# Patient Record
Sex: Female | Born: 1950 | Race: White | Hispanic: No | Marital: Single | Smoking: Former smoker
Health system: Southern US, Community
[De-identification: ages and names within clinical notes are randomized; demographics above are authoritative.]

## PROBLEM LIST (undated history)

## (undated) DIAGNOSIS — E7800 Pure hypercholesterolemia, unspecified: Secondary | ICD-10-CM

## (undated) DIAGNOSIS — I10 Essential (primary) hypertension: Secondary | ICD-10-CM

## (undated) HISTORY — PX: APPENDECTOMY: SHX54

## (undated) HISTORY — PX: BREAST BIOPSY: SHX20

## (undated) HISTORY — PX: TONSILLECTOMY: SUR1361

## (undated) HISTORY — DX: Essential (primary) hypertension: I10

## (undated) HISTORY — DX: Pure hypercholesterolemia, unspecified: E78.00

---

## 2021-08-14 ENCOUNTER — Other Ambulatory Visit: Payer: Self-pay | Admitting: Family Medicine

## 2021-08-14 DIAGNOSIS — R160 Hepatomegaly, not elsewhere classified: Secondary | ICD-10-CM

## 2021-08-24 ENCOUNTER — Ambulatory Visit
Admission: RE | Admit: 2021-08-24 | Discharge: 2021-08-24 | Disposition: A | Discharge status: Home / Self Care | Payer: Self-pay | Source: Ambulatory Visit | Attending: Family Medicine | Admitting: Family Medicine

## 2021-08-24 ENCOUNTER — Other Ambulatory Visit: Payer: Self-pay | Admitting: Family Medicine

## 2021-08-24 DIAGNOSIS — R160 Hepatomegaly, not elsewhere classified: Secondary | ICD-10-CM

## 2021-09-15 ENCOUNTER — Other Ambulatory Visit: Payer: Self-pay

## 2021-09-15 ENCOUNTER — Ambulatory Visit (HOSPITAL_COMMUNITY)
Admission: RE | Admit: 2021-09-15 | Discharge: 2021-09-15 | Disposition: A | Discharge status: Home / Self Care | Payer: No Typology Code available for payment source | Source: Ambulatory Visit | Attending: Family Medicine | Admitting: Family Medicine

## 2021-09-15 DIAGNOSIS — I6522 Occlusion and stenosis of left carotid artery: Secondary | ICD-10-CM | POA: Insufficient documentation

## 2021-09-18 ENCOUNTER — Encounter: Payer: Self-pay | Admitting: Vascular Surgery

## 2021-09-18 ENCOUNTER — Ambulatory Visit (INDEPENDENT_AMBULATORY_CARE_PROVIDER_SITE_OTHER): Payer: No Typology Code available for payment source | Admitting: Vascular Surgery

## 2021-09-18 DIAGNOSIS — I6529 Occlusion and stenosis of unspecified carotid artery: Secondary | ICD-10-CM | POA: Insufficient documentation

## 2021-09-18 DIAGNOSIS — I6523 Occlusion and stenosis of bilateral carotid arteries: Secondary | ICD-10-CM

## 2021-09-18 MED ORDER — CLOPIDOGREL BISULFATE 75 MG PO TABS: 75.0000 mg | ORAL_TABLET | Freq: Every day | ORAL | 6 refills | Status: DC

## 2021-09-18 MED ORDER — ATORVASTATIN CALCIUM 20 MG PO TABS: 20.0000 mg | ORAL_TABLET | Freq: Every day | ORAL | 11 refills | Status: DC

## 2021-09-18 NOTE — Progress Notes (Signed)
? ? ?Patient name: Sally Fernandez MRN: [id number] DOB: 12-05-1950 Sex: female ? ?REASON FOR CONSULT: History of left carotid aneurysm and stenosis (per referral) ? ?HPI: ?Sally Fernandez is a 71 y.o. female with history of hypertension and carotid artery disease that presents for evaluation of possible carotid aneurysm and stenosis.  Patient is here with her daughter who is the interpreter and she speaks [place] otherwise.  On further discussion with the daughter she states that she googled some records from [place] with a Designer, fashion/clothing and was not sure if it was an aneurysm or stenosis that her mother had.  On further discussion she is only aware that her mom has had a blockage that has been followed in [place].  No history of strokes or TIAs.  She is on Lipitor and Plavix.  She had no neck surgery or neck radiation in the past.   ? ?History reviewed. No pertinent past medical history. ? ?Past Surgical History:  ?Procedure Laterality Date  ? APPENDECTOMY    ? BREAST BIOPSY    ? TONSILLECTOMY    ? ? ?History reviewed. No pertinent family history. ? ?SOCIAL HISTORY: ?Social History  ? ?Socioeconomic History  ? Marital status: Single  ?  Spouse name: Not on file  ? Number of children: Not on file  ? Years of education: Not on file  ? Highest education level: Not on file  ?Occupational History  ? Not on file  ?Tobacco Use  ? Smoking status: Former  ?  Types: Cigarettes  ?  Quit date: 44  ?  Years since quitting: 30.3  ? Smokeless tobacco: Never  ?Substance and Sexual Activity  ? Alcohol use: Yes  ? Drug use: Never  ? Sexual activity: Not on file  ?Other Topics Concern  ? Not on file  ?Social History Narrative  ? Not on file  ? ?Social Determinants of Health  ? ?Financial Resource Strain: Not on file  ?Food Insecurity: Not on file  ?Transportation Needs: Not on file  ?Physical Activity: Not on file  ?Stress: Not on file  ?Social Connections: Not on file  ?Intimate Partner Violence: Not on file  ? ? ?No Known  Allergies ? ?Current Outpatient Medications  ?Medication Sig Dispense Refill  ? atorvastatin (LIPITOR) 20 MG tablet Take 20 mg by mouth daily.    ? ?No current facility-administered medications for this visit.  ? ? ?REVIEW OF SYSTEMS:  ?[X]  denotes positive finding, [ ]  denotes negative finding ?Cardiac  Comments:  ?Chest pain or chest pressure:    ?Shortness of breath upon exertion:    ?Short of breath when lying flat:    ?Irregular heart rhythm:    ?    ?Vascular    ?Pain in calf, thigh, or hip brought on by ambulation:    ?Pain in feet at night that wakes you up from your sleep:     ?Blood clot in your veins:    ?Leg swelling:     ?    ?Pulmonary    ?Oxygen at home:    ?Productive cough:     ?Wheezing:     ?    ?Neurologic    ?Sudden weakness in arms or legs:     ?Sudden numbness in arms or legs:     ?Sudden onset of difficulty speaking or slurred speech:    ?Temporary loss of vision in one eye:     ?Problems with dizziness:     ?    ?Gastrointestinal    ?  Blood in stool:     ?Vomited blood:     ?    ?Genitourinary    ?Burning when urinating:     ?Blood in urine:    ?    ?Psychiatric    ?Major depression:     ?    ?Hematologic    ?Bleeding problems:    ?Problems with blood clotting too easily:    ?    ?Skin    ?Rashes or ulcers:    ?    ?Constitutional    ?Fever or chills:    ? ? ?PHYSICAL EXAM: ?Vitals:  ? 09/18/21 0924 09/18/21 0928  ?BP: (!) 131/91 130/85  ?Pulse: 70 70  ?Resp: 14   ?Temp: (!) 97.3 ?F (36.3 ?C)   ?TempSrc: Temporal   ?Weight: 152 lb (68.9 kg)   ?Height: 5' 7.5" (1.715 m)   ? ? ?GENERAL: The patient is a well-nourished female, in no acute distress. The vital signs are documented above. ?CARDIAC: There is a regular rate and rhythm.  ?VASCULAR:  ?Palpable radial pulses bilaterally ?Palpable femoral pulses bilaterally ?Palpable DP pulses bilaterally ?PULMONARY: No respiratory distress. ?ABDOMEN: Soft and non-tender. ?MUSCULOSKELETAL: There are no major deformities or cyanosis. ?NEUROLOGIC: No focal  weakness or paresthesias are detected.  CN II-XII grossly intact. ?SKIN: There are no ulcers or rashes noted. ?PSYCHIATRIC: The patient has a normal affect. ? ?DATA:  ? ?Carotid duplex 09/15/21: Evidence of 60 to 79% right ICA stenosis and 40 to 59% left ICA stenosis ? ?Assessment/Plan: ? ?71 year old female presents for evaluation of carotid artery disease.  The referral states evaluate carotid aneurysm and stenosis.  On further discussion with the patient's daughter, she is only aware that her mother had carotid stenosis based on her records from [place].  This has asymptomatic carotid disease with no history of stroke or TIA.  Discussed her ultrasound today shows evidence of a 60 to 79% right ICA stenosis and a 40 to 59% left ICA stenosis.  Given her carotid disease is asymptomatic with no recent history of strokes or TIAs discussed medical management and no indication for surgical intervention unless greater than 80% stenosis and this would be for stroke risk reduction.  I did refill her Plavix that she has been on for years in [place] for her carotid stenosis and also Lipitor.  I will see her in 6 months with carotid duplex.  I did discuss basic difference of carotid endarterectomy versus TCAR if she requires intervention in the future. ? ? ?Cephus Shelling, MD ?[facility] ?Office: [phone] ? ? ? ? ?

## 2021-09-22 ENCOUNTER — Other Ambulatory Visit: Payer: Self-pay | Admitting: *Deleted

## 2021-09-22 DIAGNOSIS — I6523 Occlusion and stenosis of bilateral carotid arteries: Secondary | ICD-10-CM

## 2021-10-13 ENCOUNTER — Encounter: Payer: Self-pay | Admitting: *Deleted

## 2021-10-27 ENCOUNTER — Ambulatory Visit (INDEPENDENT_AMBULATORY_CARE_PROVIDER_SITE_OTHER): Payer: No Typology Code available for payment source | Admitting: Internal Medicine

## 2021-10-27 ENCOUNTER — Encounter: Payer: Self-pay | Admitting: Internal Medicine

## 2021-10-27 VITALS — BP 144/84 | HR 69 | Ht 67.0 in | Wt 155.0 lb

## 2021-10-27 DIAGNOSIS — R072 Precordial pain: Secondary | ICD-10-CM

## 2021-10-27 MED ORDER — METOPROLOL TARTRATE 50 MG PO TABS: 50.0000 mg | ORAL_TABLET | Freq: Once | ORAL | 0 refills | Status: DC

## 2021-10-27 NOTE — Progress Notes (Signed)
Cardiology Office Note:    Date:  10/27/2021   ID:  Sally Fernandez, DOB 12-May-1951, MRN [id number]  PCP:  Verlon Au, MD   [facility] Providers Cardiologist:  None     Referring MD: Quita Skye, PA-C   No chief complaint on file. Chest pain  History of Present Illness:    Sally Fernandez is a 71 y.o. female with a hx of rheumatoid arthritis, former smoker quit in 1993, carotid artery dx, referral for chest pain  Blood pressure yesterday with primary provider was 110/60 mmHg. She was started on losartan 25 mg daily. Past SBP up to 130s.   She notes left sided chest pain that radiates to her back. She notes she saw a cardiologist in [place]. She was told she had CAD and insufficiency of a heart valve? No hx of cath. No stress testing.  Not on anything for heart rhythm. Once in awhile can get dizzy/woozy with low Bps. With normal Bps no symptoms. She fainted 5 years ago and she fainted. She had 24 cardiac monitor.No recommendation for PPM.   BP log shows predominantly normal Bps.  Cardiovascular Studies Carotid duplex 09/15/21: Evidence of 60 to 79% right ICA stenosis and 40 to 59% left ICA stenosis  No past medical history on file.  Past Surgical History:  Procedure Laterality Date   APPENDECTOMY     BREAST BIOPSY     TONSILLECTOMY      Current Medications: No outpatient medications have been marked as taking for the 10/27/21 encounter (Appointment) with Maisie Fus, MD.     Allergies:   Patient has no known allergies.   Social History   Socioeconomic History   Marital status: Single    Spouse name: Not on file   Number of children: Not on file   Years of education: Not on file   Highest education level: Not on file  Occupational History   Not on file  Tobacco Use   Smoking status: Former    Types: Cigarettes    Quit date: 31    Years since quitting: 30.4   Smokeless tobacco: Never  Substance and Sexual Activity   Alcohol use: Yes   Drug  use: Never   Sexual activity: Not on file  Other Topics Concern   Not on file  Social History Narrative   Not on file   Social Determinants of Health   Financial Resource Strain: Not on file  Food Insecurity: Not on file  Transportation Needs: Not on file  Physical Activity: Not on file  Stress: Not on file  Social Connections: Not on file     Family History: Brother has heart disease  ROS:   Please see the history of present illness.     All other systems reviewed and are negative.  EKGs/Labs/Other Studies Reviewed:    The following studies were reviewed today:   EKG:  EKG is  ordered today.  The ekg ordered today demonstrates   10/27/2021- NSR, no q waves  Recent Labs: No results found for requested labs within last 365 days.  Recent Lipid Panel No results found for: "CHOL", "TRIG", "HDL", "CHOLHDL", "VLDL", "LDLCALC", "LDLDIRECT"   Risk Assessment/Calculations:           Physical Exam:    VS:    Vitals:   10/27/21 1440  BP: (!) 144/84  Pulse: 69  SpO2: 99%     Wt Readings from Last 3 Encounters:  09/18/21 152 lb (68.9 kg)  GEN:  Well nourished, well developed in no acute distress HEENT: Normal NECK: No JVD; No carotid bruits LYMPHATICS: No lymphadenopathy CARDIAC: RRR, no murmurs, rubs, gallops RESPIRATORY:  Clear to auscultation without rales, wheezing or rhonchi  ABDOMEN: Soft, non-tender, non-distended MUSCULOSKELETAL:  No edema; No deformity  SKIN: Warm and dry NEUROLOGIC:  Alert and oriented x 3 PSYCHIATRIC:  Normal affect   ASSESSMENT:    Chest Pain: She notes intermittent L sided chest tightness and ?hx of CAD. With risk including age and carotid dx , as well as former smoking will get a coronary CTA.  HTN: well controlled at home. Continue losartan 25 mg daily.  Carotid Artery Dx: no symptoms. She has no hx of CVA/TIA.Continue statin and plavix 75 mg daily ( started in [place]). Followed by vascular  PLAN:    In order of  problems listed above:  TTE Coronary CTA Follow up 6 months           Medication Adjustments/Labs and Tests Ordered: Current medicines are reviewed at length with the patient today.  Concerns regarding medicines are outlined above.  No orders of the defined types were placed in this encounter.  No orders of the defined types were placed in this encounter.   There are no Patient Instructions on file for this visit.   Signed, Maisie Fus, MD  10/27/2021 1:05 PM    [facility]

## 2021-10-27 NOTE — Patient Instructions (Signed)
Medication Instructions:  PLEASE TAKE METOPROLOL TARTRATE 50mg  TWO HOURS PRIOR TO CCTA SCAN  *If you need a refill on your cardiac medications before your next appointment, please call your pharmacy*  Lab Work: BLOOD WORK TODAY  If you have labs (blood work) drawn today and your tests are completely normal, you will receive your results only by: MyChart Message (if you have MyChart) OR A paper copy in the mail If you have any lab test that is abnormal or we need to change your treatment, we will call you to review the results.  Testing/Procedures: Your physician has requested that you have an echocardiogram. Echocardiography is a painless test that uses sound waves to create images of your heart. It provides your doctor with information about the size and shape of your heart and how well your heart's chambers and valves are working. You may receive an ultrasound enhancing agent through an IV if needed to better visualize your heart during the echo.This procedure takes approximately one hour. There are no restrictions for this procedure. This will take place at the [address], [address].     Your cardiac CT will be scheduled at one of the below locations:   [facility] [address] [place], [place] [phone]  If scheduled at [facility], please arrive at the [facility] and Children's Entrance (Entrance C2) of [facility] 30 minutes prior to test start time. You can use the FREE valet parking offered at entrance C (encouraged to control the heart rate for the test)  Proceed to the [facility] Radiology Department (first floor) to check-in and test prep.  All radiology patients and guests should use entrance C2 at [facility], accessed from [facility], even though the hospital's physical address listed is [address].     Please follow these instructions carefully (unless otherwise directed):  On the Night  Before the Test: Be sure to Drink plenty of water. Do not consume any caffeinated/decaffeinated beverages or chocolate 12 hours prior to your test. Do not take any antihistamines 12 hours prior to your test.  On the Day of the Test: Drink plenty of water until 1 hour prior to the test. Do not eat any food 4 hours prior to the test. You may take your regular medications prior to the test.  Take metoprolol (Lopressor) two hours prior to test. HOLD Furosemide/Hydrochlorothiazide morning of the test. FEMALES- please wear underwire-free bra if available, avoid dresses & tight clothing      After the Test: Drink plenty of water. After receiving IV contrast, you may experience a mild flushed feeling. This is normal. On occasion, you may experience a mild rash up to 24 hours after the test. This is not dangerous. If this occurs, you can take Benadryl 25 mg and increase your fluid intake. If you experience trouble breathing, this can be serious. If it is severe call 911 IMMEDIATELY. If it is mild, please call our office. If you take any of these medications: Glipizide/Metformin, Avandament, Glucavance, please do not take 48 hours after completing test unless otherwise instructed.  We will call to schedule your test 2-4 weeks out understanding that some insurance companies will need an authorization prior to the service being performed.   For non-scheduling related questions, please contact the cardiac imaging nurse navigator should you have any questions/concerns: [address], Cardiac Imaging Nurse Navigator [place], Cardiac Imaging Nurse Navigator [facility]  Direct Office Dial: [phone]   For scheduling needs, including cancellations and rescheduling, please call [place], [phone].  Follow-Up: At [facility], you and your health needs are our priority.  As part of our continuing mission to provide you with exceptional heart care, we have created  designated Provider Care Teams.  These Care Teams include your primary Cardiologist (physician) and Advanced Practice Providers (APPs -  Physician Assistants and Nurse Practitioners) who all work together to provide you with the care you need, when you need it.  Your next appointment:   6 month(s)  The format for your next appointment:   In Person  Provider:   Maisie Fus, MD

## 2021-10-28 LAB — BASIC METABOLIC PANEL
BUN/Creatinine Ratio: 21 (ref 12–28)
BUN: 15 mg/dL (ref 8–27)
CO2: 27 mmol/L (ref 20–29)
Calcium: 9.7 mg/dL (ref 8.7–10.3)
Chloride: 100 mmol/L (ref 96–106)
Creatinine, Ser: 0.73 mg/dL (ref 0.57–1.00)
Glucose: 84 mg/dL (ref 70–99)
Potassium: 4.3 mmol/L (ref 3.5–5.2)
Sodium: 138 mmol/L (ref 134–144)
eGFR: 88 mL/min/{1.73_m2} (ref >59)

## 2021-11-09 ENCOUNTER — Ambulatory Visit (HOSPITAL_COMMUNITY): Payer: No Typology Code available for payment source | Attending: Internal Medicine

## 2021-11-09 DIAGNOSIS — R072 Precordial pain: Secondary | ICD-10-CM

## 2021-11-09 LAB — ECHOCARDIOGRAM COMPLETE
Area-P 1/2: 3.46 cm2
S' Lateral: 2.7 cm

## 2021-11-16 ENCOUNTER — Telehealth (HOSPITAL_COMMUNITY): Payer: Self-pay | Admitting: *Deleted

## 2021-11-16 NOTE — Telephone Encounter (Signed)
Reaching out to patient to offer assistance regarding upcoming cardiac imaging study; pt's daughter answered phone and verbalizes understanding of appt date/time, parking situation and where to check in, pre-test NPO status and medications ordered, and verified current allergies; name and call back number provided for further questions should they arise  Larey Brick RN Navigator Cardiac Imaging [facility] [phone] office [phone] cell  Patient speaks [place]. Patient will take 50mg  metoprolol tartrate TWO hours prior to her cardiac CT scan. Patient's daughter is aware they will need to arrive at 12pm.

## 2021-11-18 ENCOUNTER — Other Ambulatory Visit: Payer: Self-pay | Admitting: Cardiovascular Disease

## 2021-11-18 ENCOUNTER — Ambulatory Visit (HOSPITAL_COMMUNITY)
Admission: RE | Admit: 2021-11-18 | Discharge: 2021-11-18 | Disposition: A | Discharge status: Home / Self Care | Payer: No Typology Code available for payment source | Source: Ambulatory Visit | Attending: Cardiovascular Disease | Admitting: Cardiovascular Disease

## 2021-11-18 ENCOUNTER — Ambulatory Visit (HOSPITAL_BASED_OUTPATIENT_CLINIC_OR_DEPARTMENT_OTHER)
Admission: RE | Admit: 2021-11-18 | Discharge: 2021-11-18 | Disposition: A | Discharge status: Home / Self Care | Payer: No Typology Code available for payment source | Source: Ambulatory Visit | Attending: Cardiovascular Disease | Admitting: Cardiovascular Disease

## 2021-11-18 ENCOUNTER — Ambulatory Visit (HOSPITAL_COMMUNITY)
Admission: RE | Admit: 2021-11-18 | Discharge: 2021-11-18 | Disposition: A | Discharge status: Home / Self Care | Payer: No Typology Code available for payment source | Source: Ambulatory Visit | Attending: Internal Medicine | Admitting: Internal Medicine

## 2021-11-18 DIAGNOSIS — I2510 Atherosclerotic heart disease of native coronary artery without angina pectoris: Secondary | ICD-10-CM

## 2021-11-18 DIAGNOSIS — R931 Abnormal findings on diagnostic imaging of heart and coronary circulation: Secondary | ICD-10-CM

## 2021-11-18 DIAGNOSIS — R072 Precordial pain: Secondary | ICD-10-CM | POA: Insufficient documentation

## 2021-11-18 MED ORDER — NITROGLYCERIN 0.4 MG SL SUBL
SUBLINGUAL_TABLET | SUBLINGUAL | Status: AC
  Filled 2021-11-18: qty 2

## 2021-11-18 MED ORDER — IOHEXOL 350 MG/ML SOLN
100.0000 mL | Freq: Once | INTRAVENOUS | Status: AC | PRN
  Administered 2021-11-18: 100 mL via INTRAVENOUS

## 2021-11-18 MED ORDER — NITROGLYCERIN 0.4 MG SL SUBL
0.8000 mg | SUBLINGUAL_TABLET | Freq: Once | SUBLINGUAL | Status: AC
  Administered 2021-11-18: 0.8 mg via SUBLINGUAL

## 2021-11-18 NOTE — Progress Notes (Signed)
CT FFR ordered.   Gerri Spore T. Flora Lipps, MD, [facility]  [facility]  [address] [place], [place] [address] [phone]  3:41 PM

## 2021-11-23 ENCOUNTER — Other Ambulatory Visit: Payer: Self-pay | Admitting: *Deleted

## 2021-11-23 DIAGNOSIS — I6523 Occlusion and stenosis of bilateral carotid arteries: Secondary | ICD-10-CM

## 2021-11-23 DIAGNOSIS — I6522 Occlusion and stenosis of left carotid artery: Secondary | ICD-10-CM

## 2021-11-23 NOTE — Progress Notes (Signed)
lipid

## 2021-12-03 ENCOUNTER — Other Ambulatory Visit: Payer: Self-pay | Admitting: Physician Assistant

## 2021-12-03 ENCOUNTER — Ambulatory Visit
Admission: RE | Admit: 2021-12-03 | Discharge: 2021-12-03 | Disposition: A | Discharge status: Home / Self Care | Payer: No Typology Code available for payment source | Source: Ambulatory Visit | Attending: Physician Assistant | Admitting: Physician Assistant

## 2021-12-03 DIAGNOSIS — M25552 Pain in left hip: Secondary | ICD-10-CM

## 2021-12-11 LAB — LIPID PANEL
Chol/HDL Ratio: 3 ratio (ref 0.0–4.4)
Cholesterol, Total: 150 mg/dL (ref 100–199)
HDL: 50 mg/dL (ref >39)
LDL Chol Calc (NIH): 86 mg/dL (ref 0–99)
Triglycerides: 73 mg/dL (ref 0–149)
VLDL Cholesterol Cal: 14 mg/dL (ref 5–40)

## 2021-12-15 ENCOUNTER — Encounter: Payer: Self-pay | Admitting: *Deleted

## 2021-12-15 ENCOUNTER — Telehealth: Payer: Self-pay | Admitting: *Deleted

## 2021-12-15 MED ORDER — ASPIRIN 81 MG PO TBEC: 81.0000 mg | DELAYED_RELEASE_TABLET | Freq: Every day | ORAL | 3 refills | Status: AC

## 2021-12-15 MED ORDER — ATORVASTATIN CALCIUM 40 MG PO TABS: 40.0000 mg | ORAL_TABLET | Freq: Every day | ORAL | 3 refills | Status: DC

## 2021-12-15 NOTE — Telephone Encounter (Signed)
-----   Message from Maisie Fus, MD sent at 12/12/2021  5:20 PM EDT ----- Timmie Foerster, please let Mrs Slawson know that she her bad cholesterol is nearly at goal. Ideally should be < 70 m/dL. Please increase her atorvastatin to 40 mg daily. Also recommend an aspirin 81 mg

## 2022-03-23 ENCOUNTER — Ambulatory Visit (HOSPITAL_COMMUNITY)
Admission: RE | Admit: 2022-03-23 | Discharge: 2022-03-23 | Disposition: A | Discharge status: Home / Self Care | Payer: Self-pay | Source: Ambulatory Visit | Attending: Vascular Surgery | Admitting: Vascular Surgery

## 2022-03-23 ENCOUNTER — Encounter: Payer: Self-pay | Admitting: Vascular Surgery

## 2022-03-23 ENCOUNTER — Ambulatory Visit (INDEPENDENT_AMBULATORY_CARE_PROVIDER_SITE_OTHER): Payer: Self-pay | Admitting: Vascular Surgery

## 2022-03-23 VITALS — BP 129/81 | HR 66 | Temp 98.3°F | Resp 20 | Ht 67.0 in | Wt 153.0 lb

## 2022-03-23 DIAGNOSIS — I6523 Occlusion and stenosis of bilateral carotid arteries: Secondary | ICD-10-CM | POA: Insufficient documentation

## 2022-03-23 NOTE — Progress Notes (Signed)
Patient name: Sally Fernandez MRN: [id number] DOB: [birth year] Sex: female  REASON FOR CONSULT: 4-month follow-up for surveillance of carotid artery disease  HPI: Sally Fernandez is a 71 y.o. female with history of hypertension and carotid artery disease that presents for 6 month follow-up of carotid artery disease.  Patient is here with her daughter who is the interpreter and she speaks [place] otherwise.  She reports no new neurologic symptoms over the last 6 months.  No history of stroke or TIA.  She is on 40 mg of Lipitor and Plavix.  No history of neck surgery or radiation.  Carotid artery disease was previously followed in [place].  Previously had a 60-79% right ICA stenosis and 40-59% left ICA stenosis.   History reviewed. No pertinent past medical history.  Past Surgical History:  Procedure Laterality Date   APPENDECTOMY     BREAST BIOPSY     TONSILLECTOMY      History reviewed. No pertinent family history.  SOCIAL HISTORY: Social History   Socioeconomic History   Marital status: Single    Spouse name: Not on file   Number of children: Not on file   Years of education: Not on file   Highest education level: Not on file  Occupational History   Not on file  Tobacco Use   Smoking status: Former    Types: Cigarettes    Quit date: 57    Years since quitting: 30.8   Smokeless tobacco: Never  Substance and Sexual Activity   Alcohol use: Yes   Drug use: Never   Sexual activity: Not on file  Other Topics Concern   Not on file  Social History Narrative   Not on file   Social Determinants of Health   Financial Resource Strain: Not on file  Food Insecurity: Not on file  Transportation Needs: Not on file  Physical Activity: Not on file  Stress: Not on file  Social Connections: Not on file  Intimate Partner Violence: Not on file    No Known Allergies  Current Outpatient Medications  Medication Sig Dispense Refill   aspirin EC 81 MG tablet Take 1 tablet (81  mg total) by mouth daily. Swallow whole. 90 tablet 3   atorvastatin (LIPITOR) 40 MG tablet Take 1 tablet (40 mg total) by mouth daily. 90 tablet 3   clopidogrel (PLAVIX) 75 MG tablet Take 1 tablet (75 mg total) by mouth daily. 30 tablet 6   No current facility-administered medications for this visit.    REVIEW OF SYSTEMS:  [X]  denotes positive finding, [ ]  denotes negative finding Cardiac  Comments:  Chest pain or chest pressure:    Shortness of breath upon exertion:    Short of breath when lying flat:    Irregular heart rhythm:        Vascular    Pain in calf, thigh, or hip brought on by ambulation:    Pain in feet at night that wakes you up from your sleep:     Blood clot in your veins:    Leg swelling:         Pulmonary    Oxygen at home:    Productive cough:     Wheezing:         Neurologic    Sudden weakness in arms or legs:     Sudden numbness in arms or legs:     Sudden onset of difficulty speaking or slurred speech:    Temporary loss of vision in one eye:  Problems with dizziness:         Gastrointestinal    Blood in stool:     Vomited blood:         Genitourinary    Burning when urinating:     Blood in urine:        Psychiatric    Major depression:         Hematologic    Bleeding problems:    Problems with blood clotting too easily:        Skin    Rashes or ulcers:        Constitutional    Fever or chills:      PHYSICAL EXAM: Vitals:   03/23/22 1125 03/23/22 1127  BP: 133/82 129/81  Pulse: 66   Resp: 20   Temp: 98.3 F (36.8 C)   SpO2: 97%   Weight: 153 lb (69.4 kg)   Height: 5\' 7"  (1.702 m)     GENERAL: The patient is a well-nourished female, in no acute distress. The vital signs are documented above. CARDIAC: There is a regular rate and rhythm.  VASCULAR:  Palpable radial pulses bilaterally PULMONARY: No respiratory distress. ABDOMEN: Soft and non-tender. MUSCULOSKELETAL: There are no major deformities or cyanosis. NEUROLOGIC:  No focal weakness or paresthesias are detected.  CN II-XII grossly intact. SKIN: There are no ulcers or rashes noted. PSYCHIATRIC: The patient has a normal affect.  DATA:   Carotid duplex 03/23/22: Evidence of 60 to 79% right ICA stenosis and 40 to 59% left ICA stenosis  Assessment/Plan:  71 year old female presents for 6 month follow-up of her carotid artery disease.  This is asymptomatic carotid disease with no history of stroke or TIA.  Discussed her ultrasound today again shows evidence of a stable 60 to 79% right ICA stenosis and a 40 to 59% left ICA stenosis.  Given her carotid disease is asymptomatic with no recent history of strokes or TIAs discussed discussed medical management and no indication for surgical intervention unless greater than 80% stenosis and this would be for stroke risk reduction.  I will plan to see her again in 6 months with repeat carotid duplex.   Marty Heck, MD [facility] [place] Office: [phone]

## 2022-03-30 ENCOUNTER — Other Ambulatory Visit: Payer: Self-pay

## 2022-03-30 DIAGNOSIS — I6523 Occlusion and stenosis of bilateral carotid arteries: Secondary | ICD-10-CM

## 2022-04-13 NOTE — Progress Notes (Signed)
Office Visit Note  Patient: Sally Fernandez             Date of Birth: 28-Sep-1950           MRN: [id number]             PCP: Drue Flirt, MD Referring: Jorge Ny, PA-C Visit Date: 04/20/2022 Occupation: @GUAROCC @  Interpreter: Sally Fernandez(daughter)  Subjective:  Pain in multiple joints  History of Present Illness: Sally Fernandez is a 71 y.o. female from [place].  She is accompanied by her daughter Sally Fernandez who is the interpreter.  According the patient her symptoms a started for for 5 years ago while she was living in [place].  About 2 years ago she started having pain in her lower back, left hip which was radiating into her left knee.  She was seen by an orthopedic surgeon who did lab work.  Her labs were positive for rheumatoid factor.He also started giving her" some special shots" which were given every day for 10 days twice a year which relieved her pain.  She does not think those were steroid shots.  She moved to [place] in October 2022.  She states the pain has been worse over the last few months in her left hip.  She describes nocturnal pain, pain when she wakes up in the morning and difficulty walking at times.  She had a Kenalog injection in August 2023 which helped.  She also has been experiencing pain in her other joints over the last 1 year.  She describes pain in her right shoulder, bilateral wrists, bilateral hands, left knee joint and her feet.  She describes pain mostly in her bilateral first MTPs.  She was given Naprosyn and ibuprofen and baclofen in the past which caused excessive drowsiness and she discontinued the medications.  She is currently not taking any medications for pain.  She also had a x-ray of her left hip joint in the past which showed moderate osteoarthritis in the left hip joint.  She denies any discomfort in her left inguinal region.  She states she has had difficulty swallowing for a long time.  She was evaluated in [place] by ENT and no etiology was  established.  She gives history of intermittent chest pain and palpitations.  She has been seeing a cardiologist.  There is no family history of rheumatoid arthritis.  She is gravida 4, para 2, abortions 2.  There is no history of DVTs.    Activities of Daily Living:  Patient reports morning stiffness for 1.5 hours.   Patient Reports nocturnal pain.  Difficulty dressing/grooming: Denies Difficulty climbing stairs: Reports Difficulty getting out of chair: Denies Difficulty using hands for taps, buttons, cutlery, and/or writing: Denies  Review of Systems  Constitutional:  Positive for fatigue.  HENT:  Positive for mouth sores, trouble swallowing, trouble swallowing and mouth dryness.   Eyes:  Positive for dryness.  Respiratory:  Negative for difficulty breathing.   Cardiovascular:  Positive for palpitations.  Gastrointestinal:  Positive for constipation. Negative for blood in stool and diarrhea.  Endocrine: Positive for increased urination.  Genitourinary:  Negative for involuntary urination.  Musculoskeletal:  Positive for joint pain, gait problem, joint pain, joint swelling, myalgias, morning stiffness and myalgias.  Skin:  Negative for color change, rash, hair loss and sensitivity to sunlight.  Allergic/Immunologic: Negative for susceptible to infections.  Neurological:  Positive for dizziness and headaches.  Hematological:  Positive for swollen glands.  Psychiatric/Behavioral:  Positive for depressed mood and sleep  disturbance. The patient is nervous/anxious.     PMFS History:  Patient Active Problem List   Diagnosis Date Noted   Essential hypertension 04/20/2022   History of hyperlipidemia 04/20/2022   Carotid stenosis 09/18/2021    Past Medical History:  Diagnosis Date   High cholesterol    HTN (hypertension)     Family History  Problem Relation Age of Onset   Stomach cancer Mother    Stroke Mother    Tuberculosis Father    Past Surgical History:  Procedure  Laterality Date   APPENDECTOMY     BREAST BIOPSY     TONSILLECTOMY     Social History   Social History Narrative   Not on file    There is no immunization history on file for this patient.   Objective: Vital Signs: BP 135/80 (BP Location: Right Arm, Patient Position: Sitting, Cuff Size: Normal)   Pulse 71   Resp 15   Ht 5\' 7"  (1.702 m)   Wt 155 lb (70.3 kg)   BMI 24.28 kg/m    Physical Exam Vitals and nursing note reviewed.  Constitutional:      Appearance: She is well-developed.  HENT:     Head: Normocephalic and atraumatic.  Eyes:     Conjunctiva/sclera: Conjunctivae normal.  Cardiovascular:     Rate and Rhythm: Normal rate and regular rhythm.     Heart sounds: Normal heart sounds.  Pulmonary:     Effort: Pulmonary effort is normal.     Breath sounds: Normal breath sounds.  Abdominal:     General: Bowel sounds are normal.     Palpations: Abdomen is soft.  Musculoskeletal:     Cervical back: Normal range of motion.  Lymphadenopathy:     Cervical: No cervical adenopathy.  Skin:    General: Skin is warm and dry.     Capillary Refill: Capillary refill takes less than 2 seconds.  Neurological:     Mental Status: She is alert and oriented to person, place, and time.  Psychiatric:        Behavior: Behavior normal.      Musculoskeletal Exam: She had limited range of motion of the cervical spine with lateral rotation.  She had painful limited range of motion of her lumbar spine.  Lumbar scoliosis was noted.  She had no point tenderness over the lumbar spine.  Shoulder joints, elbow joints, wrist joints with good range of motion.  She describes discomfort over her wrist joints.  MCPs PIPs and DIPs Juengel range of motion.  She had bilateral PIP and DIP thickening with discomfort over PIP and DIP joints.  No synovitis was noted.  She had very limited range of motion of bilateral hip joints with tenderness over left trochanteric bursa.  She had painful range of motion of  bilateral knee joints without any warmth swelling or effusion.  There was no tenderness over ankles or MTPs.  Bilateral first MTP thickening was noted.  CDAI Exam: CDAI Score: -- Patient Global: --; Provider Global: -- Swollen: --; Tender: -- Joint Exam 04/20/2022   No joint exam has been documented for this visit   There is currently no information documented on the homunculus. Go to the Rheumatology activity and complete the homunculus joint exam.  Investigation: No additional findings.  Imaging: XR Lumbar Spine 2-3 Views  Result Date: 04/20/2022 Dextroscoliosis was noted.  Multilevel spondylosis with severe narrowing between L2-L3, L3-L4, L4-L5 was noted.  Facet joint arthropathy with foraminal narrowing was noted. Impression: These  findings are consistent with dextroscoliosis, multilevel severe spondylosis and facet joint arthropathy.  XR Hand 2 View Left  Result Date: 04/20/2022 CMC, PIP and DIP narrowing was noted.  No MCP, intercarpal radiocarpal joint space narrowing was noted.  No erosive changes were noted. Impression: These findings are consistent with osteoarthritis of the hand.  XR Hand 2 View Right  Result Date: 04/20/2022 CMC, PIP and DIP narrowing was noted.  No MCP, intercarpal radiocarpal joint space narrowing was noted.  No erosive changes were noted. Impression: These findings are consistent with osteoarthritis of the hand.  XR KNEE 3 VIEW LEFT  Result Date: 04/20/2022 Moderate medial compartment narrowing with medial and intercondylar osteophytes were noted.  Moderate patellofemoral narrowing was noted.  Possible chondrocalcinosis was noted. 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  XR Foot 2 Views Left  Result Date: 04/20/2022 First MTP narrowing with subchondral sclerosis was noted.  PIP and DIP narrowing was noted.  No intertarsal, tibiotalar or subtalar joint  space narrowing was noted.  No erosive changes were noted. Impression: These findings are consistent with osteoarthritis of the foot.  XR Foot 2 Views Right  Result Date: 04/20/2022 Severe first MTP narrowing with subchondral sclerosis and spurring was noted.  PIP and DIP narrowing was noted.  No intertarsal, tibiotalar or subtalar joint space narrowing was noted.  Inferior calcaneal spur was noted.  No erosive changes were noted. Impression: These findings were consistent with osteoarthritis of the foot.  XR HIP UNILAT W OR W/O PELVIS 2-3 VIEWS RIGHT  Result Date: 04/20/2022 Moderate hip joint narrowing with subchondral sclerosis and spurring was noted. Impression: These findings are consistent with moderate osteoarthritis of the hip joint.  VAS US CAROTID  Result Date: 03/23/2022 Carotid Arterial Duplex Study Patient Name:  SHANIEKA SABADOS  Date of Exam:   03/23/2022 Medical Rec #: [id number]         Accession #:    [id number] Date of Birth: [birth year]         Patient Gender: F Patient Age:   50 years Exam Location:  [facility] Procedure:      VAS US CAROTID Referring Phys: Monica Martinez --------------------------------------------------------------------------------  Indications:       Carotid artery disease. Comparison Study:  09/15/21: Right 196/72 cm/s 60-79% Left 102/41 cm/s mid 40-59% Performing Technologist: Ralene Cork RVT Supporting Technologist: Ralene Cork RVT  Examination Guidelines: A complete evaluation includes B-mode imaging, spectral Doppler, color Doppler, and power Doppler as needed of all accessible portions of each vessel. Bilateral testing is considered an integral part of a complete examination. Limited examinations for reoccurring indications may be performed as noted.  Right Carotid Findings: +----------+--------+--------+--------+------------------+--------+           PSV cm/sEDV cm/sStenosisPlaque DescriptionComments  +----------+--------+--------+--------+------------------+--------+ CCA Prox  83      11                                         +----------+--------+--------+--------+------------------+--------+ CCA Mid   89      17                                         +----------+--------+--------+--------+------------------+--------+ CCA Distal67      22                                         +----------+--------+--------+--------+------------------+--------+  ICA Prox  227     75      60-79%  heterogenous               +----------+--------+--------+--------+------------------+--------+ ICA Mid   135     41                                         +----------+--------+--------+--------+------------------+--------+ ICA Distal103     29                                         +----------+--------+--------+--------+------------------+--------+ ECA       74      10              heterogenous               +----------+--------+--------+--------+------------------+--------+ +----------+--------+-------+----------------+-------------------+           PSV cm/sEDV cmsDescribe        Arm Pressure (mmHG) +----------+--------+-------+----------------+-------------------+ [id number]             Multiphasic, [id number]                 +----------+--------+-------+----------------+-------------------+ +---------+--------+--+--------+--+---------+ VertebralPSV cm/s58EDV cm/s17Antegrade +---------+--------+--+--------+--+---------+  Left Carotid Findings: +----------+--------+--------+--------+------------------+--------+           PSV cm/sEDV cm/sStenosisPlaque DescriptionComments +----------+--------+--------+--------+------------------+--------+ CCA Prox  85      20                                         +----------+--------+--------+--------+------------------+--------+ CCA Mid   83      20                                          +----------+--------+--------+--------+------------------+--------+ CCA Distal74      25                                         +----------+--------+--------+--------+------------------+--------+ ICA Prox  62      22      1-39%   heterogenous               +----------+--------+--------+--------+------------------+--------+ ICA Mid   119     44      40-59%  heterogenous               +----------+--------+--------+--------+------------------+--------+ ICA Distal103     43                                         +----------+--------+--------+--------+------------------+--------+ ECA       67      10                                         +----------+--------+--------+--------+------------------+--------+ +----------+--------+--------+----------------+-------------------+           PSV cm/sEDV cm/sDescribe  Arm Pressure (mmHG) +----------+--------+--------+----------------+-------------------+ Subclavian130     27      Multiphasic, [id number]                 +----------+--------+--------+----------------+-------------------+ +---------+--------+--+--------+--+---------+ VertebralPSV cm/s57EDV cm/s15Antegrade +---------+--------+--+--------+--+---------+   Summary: Right Carotid: Velocities in the right ICA are consistent with a 60-79%                stenosis. Left Carotid: Velocities in the left mid ICA are consistent with a 40-59%               stenosis. Vertebrals:  Bilateral vertebral arteries demonstrate antegrade flow. Subclavians: Normal flow hemodynamics were seen in bilateral subclavian              arteries. *See table(s) above for measurements and observations.  Electronically signed by Sherald Hess MD on 03/23/2022 at 11:29:43 AM.    Final     Recent Labs: Lab Results  Component Value Date   NA 138 10/27/2021   K 4.3 10/27/2021   CL 100 10/27/2021   CO2 27 10/27/2021   GLUCOSE 84 10/27/2021   BUN 15 10/27/2021   CREATININE 0.73  10/27/2021   CALCIUM 9.7 10/27/2021    Speciality Comments: No specialty comments available.  Procedures:  No procedures performed Allergies: Patient has no known allergies.   Assessment / Plan:     Visit Diagnoses: Rheumatoid factor positive -patient states her rheumatoid factor was positive while she was living in [place].  She does not recall being treated with any medications.  She has taken anti-inflammatories intermittently.  Pain in both hands -she gives history of pain and discomfort in her bilateral hands.  She gives history of intermittent swelling in her wrist and hands.  No synovitis was noted on the examination today.  Plan: XR Hand 2 View Right, XR Hand 2 View Left, x-rays of bilateral hands were consistent with osteoarthritis.  Sedimentation rate, Rheumatoid factor, Cyclic citrul peptide antibody, IgG, ANA  Pain in right hip -she had limited range of motion of her right hip joint but she denies much discomfort.  Plan: XR HIP UNILAT W OR W/O PELVIS 2-3 VIEWS RIGHT.  X-ray of the hip joint showed moderate osteoarthritis.  Trochanteric bursitis, left hip-she has been having nocturnal pain and difficulty walking due to ongoing pain and discomfort in her left hip over the last 5 to 6 years.  She has had some form of injections in her left trochanteric region in [place].  She had a cortisone injection in her left trochanteric region in August 2023.  She had some relief from that.  I gave her a handout on IT band stretches.  Primary osteoarthritis of left hip-I reviewed x-rays of her left hip joint from December 03, 2021 which showed moderate osteoarthritis.  Chronic pain of left knee -she complains of pain and discomfort in her left knee.  No warmth swelling or effusion was noted.  Plan: XR KNEE 3 VIEW LEFT.  X-rays showed moderate osteoarthritis, moderate chondromalacia patella and possible chondrocalcinosis.  Pain in both feet -she complains of discomfort in her bilateral feet.  No  synovitis was noted.  Bilateral first MTP thickening was noted..  Plan: XR Foot 2 Views Right, XR Foot 2 Views Left.  X-rays were consistent with osteoarthritis of bilateral feet.  Chronic midline low back pain without sciatica -she complains of pain and discomfort in her lower back.  Plan: XR Lumbar Spine 2-3 Views.  X-rays showed severe dextroscoliosis, multilevel spondylosis and facet joint  arthropathy.  Other fatigue - Plan: CBC with Differential/Platelet, COMPLETE METABOLIC PANEL WITH GFR, CK, TSH, Glucose 6 phosphate dehydrogenase  History of osteoporosis-I do not have the results available.  Essential hypertension-blood pressure was normal today.  History of hyperlipidemia  Left carotid artery stenosis  Dysphagia-patient gives history of dysphagia for several years.  She was evaluated and read by an ENT doctor.  According to her the work-up was negative.  I discussed with her possible referral to gastroenterology for evaluation.  She will discuss with her PCP.  Language barrier-patient speaks [place].  Daughter was the interpreter.  Orders: Orders Placed This Encounter  Procedures   XR Hand 2 View Right   XR Hand 2 View Left   XR Foot 2 Views Right   XR Foot 2 Views Left   XR Lumbar Spine 2-3 Views   XR KNEE 3 VIEW LEFT   XR HIP UNILAT W OR W/O PELVIS 2-3 VIEWS RIGHT   CBC with Differential/Platelet   COMPLETE METABOLIC PANEL WITH GFR   Sedimentation rate   CK   TSH   Rheumatoid factor   Cyclic citrul peptide antibody, IgG   ANA   Glucose 6 phosphate dehydrogenase   No orders of the defined types were placed in this encounter.    Follow-Up Instructions: Return for Pain in multiple joints.   Bo Merino, MD  Note - This record has been created using Editor, commissioning.  Chart creation errors have been sought, but may not always  have been located. Such creation errors do not reflect on  the standard of medical care.,

## 2022-04-20 ENCOUNTER — Ambulatory Visit (INDEPENDENT_AMBULATORY_CARE_PROVIDER_SITE_OTHER): Payer: No Typology Code available for payment source

## 2022-04-20 ENCOUNTER — Ambulatory Visit (INDEPENDENT_AMBULATORY_CARE_PROVIDER_SITE_OTHER): Payer: No Typology Code available for payment source | Admitting: Rheumatology

## 2022-04-20 ENCOUNTER — Ambulatory Visit: Payer: No Typology Code available for payment source

## 2022-04-20 ENCOUNTER — Other Ambulatory Visit
Admission: RE | Admit: 2022-04-20 | Discharge: 2022-04-20 | Disposition: A | Discharge status: Home / Self Care | Payer: No Typology Code available for payment source | Source: Ambulatory Visit | Attending: Rheumatology | Admitting: Rheumatology

## 2022-04-20 ENCOUNTER — Encounter: Payer: Self-pay | Admitting: Rheumatology

## 2022-04-20 VITALS — BP 135/80 | HR 71 | Resp 15 | Ht 67.0 in | Wt 155.0 lb

## 2022-04-20 DIAGNOSIS — M25551 Pain in right hip: Secondary | ICD-10-CM

## 2022-04-20 DIAGNOSIS — I6522 Occlusion and stenosis of left carotid artery: Secondary | ICD-10-CM | POA: Insufficient documentation

## 2022-04-20 DIAGNOSIS — M25562 Pain in left knee: Secondary | ICD-10-CM

## 2022-04-20 DIAGNOSIS — Z789 Other specified health status: Secondary | ICD-10-CM

## 2022-04-20 DIAGNOSIS — R768 Other specified abnormal immunological findings in serum: Secondary | ICD-10-CM

## 2022-04-20 DIAGNOSIS — M79671 Pain in right foot: Secondary | ICD-10-CM

## 2022-04-20 DIAGNOSIS — M79641 Pain in right hand: Secondary | ICD-10-CM

## 2022-04-20 DIAGNOSIS — M545 Low back pain, unspecified: Secondary | ICD-10-CM

## 2022-04-20 DIAGNOSIS — I10 Essential (primary) hypertension: Secondary | ICD-10-CM

## 2022-04-20 DIAGNOSIS — M2242 Chondromalacia patellae, left knee: Secondary | ICD-10-CM | POA: Insufficient documentation

## 2022-04-20 DIAGNOSIS — M19071 Primary osteoarthritis, right ankle and foot: Secondary | ICD-10-CM | POA: Insufficient documentation

## 2022-04-20 DIAGNOSIS — M1612 Unilateral primary osteoarthritis, left hip: Secondary | ICD-10-CM | POA: Insufficient documentation

## 2022-04-20 DIAGNOSIS — R1319 Other dysphagia: Secondary | ICD-10-CM

## 2022-04-20 DIAGNOSIS — M19042 Primary osteoarthritis, left hand: Secondary | ICD-10-CM | POA: Insufficient documentation

## 2022-04-20 DIAGNOSIS — M19041 Primary osteoarthritis, right hand: Secondary | ICD-10-CM | POA: Insufficient documentation

## 2022-04-20 DIAGNOSIS — G8929 Other chronic pain: Secondary | ICD-10-CM

## 2022-04-20 DIAGNOSIS — M1712 Unilateral primary osteoarthritis, left knee: Secondary | ICD-10-CM | POA: Insufficient documentation

## 2022-04-20 DIAGNOSIS — M19072 Primary osteoarthritis, left ankle and foot: Secondary | ICD-10-CM | POA: Insufficient documentation

## 2022-04-20 DIAGNOSIS — M79642 Pain in left hand: Secondary | ICD-10-CM

## 2022-04-20 DIAGNOSIS — Z8739 Personal history of other diseases of the musculoskeletal system and connective tissue: Secondary | ICD-10-CM

## 2022-04-20 DIAGNOSIS — E785 Hyperlipidemia, unspecified: Secondary | ICD-10-CM | POA: Insufficient documentation

## 2022-04-20 DIAGNOSIS — M419 Scoliosis, unspecified: Secondary | ICD-10-CM | POA: Insufficient documentation

## 2022-04-20 DIAGNOSIS — M79672 Pain in left foot: Secondary | ICD-10-CM

## 2022-04-20 DIAGNOSIS — R1310 Dysphagia, unspecified: Secondary | ICD-10-CM | POA: Insufficient documentation

## 2022-04-20 DIAGNOSIS — M7062 Trochanteric bursitis, left hip: Secondary | ICD-10-CM | POA: Insufficient documentation

## 2022-04-20 DIAGNOSIS — R5383 Other fatigue: Secondary | ICD-10-CM

## 2022-04-20 DIAGNOSIS — Z8639 Personal history of other endocrine, nutritional and metabolic disease: Secondary | ICD-10-CM

## 2022-04-20 DIAGNOSIS — M0579 Rheumatoid arthritis with rheumatoid factor of multiple sites without organ or systems involvement: Secondary | ICD-10-CM

## 2022-04-20 DIAGNOSIS — M47816 Spondylosis without myelopathy or radiculopathy, lumbar region: Secondary | ICD-10-CM | POA: Insufficient documentation

## 2022-04-20 LAB — CBC WITH DIFFERENTIAL/PLATELET
Abs Immature Granulocytes: 0.02 10*3/uL (ref 0.00–0.07)
Basophils Absolute: 0 10*3/uL (ref 0.0–0.1)
Basophils Relative: 0 %
Eosinophils Absolute: 0.1 10*3/uL (ref 0.0–0.5)
Eosinophils Relative: 2 %
HCT: 39.1 % (ref 36.0–46.0)
Hemoglobin: 12.5 g/dL (ref 12.0–15.0)
Immature Granulocytes: 0 %
Lymphocytes Relative: 17 %
Lymphs Abs: 1.2 10*3/uL (ref 0.7–4.0)
MCH: 30.3 pg (ref 26.0–34.0)
MCHC: 32 g/dL (ref 30.0–36.0)
MCV: 94.9 fL (ref 80.0–100.0)
Monocytes Absolute: 0.8 10*3/uL (ref 0.1–1.0)
Monocytes Relative: 12 %
Neutro Abs: 4.6 10*3/uL (ref 1.7–7.7)
Neutrophils Relative %: 69 %
Platelets: 225 10*3/uL (ref 150–400)
RBC: 4.12 MIL/uL (ref 3.87–5.11)
RDW: 12.6 % (ref 11.5–15.5)
WBC: 6.8 10*3/uL (ref 4.0–10.5)
nRBC: 0 % (ref 0.0–0.2)

## 2022-04-20 LAB — SEDIMENTATION RATE: Sed Rate: 36 mm/hr — ABNORMAL HIGH (ref 0–22)

## 2022-04-20 LAB — COMPREHENSIVE METABOLIC PANEL
ALT: 23 U/L (ref 0–44)
AST: 25 U/L (ref 15–41)
Albumin: 3.9 g/dL (ref 3.5–5.0)
Alkaline Phosphatase: 82 U/L (ref 38–126)
Anion gap: 7 (ref 5–15)
BUN: 12 mg/dL (ref 8–23)
CO2: 28 mmol/L (ref 22–32)
Calcium: 9.4 mg/dL (ref 8.9–10.3)
Chloride: 106 mmol/L (ref 98–111)
Creatinine, Ser: 0.73 mg/dL (ref 0.44–1.00)
GFR, Estimated: >60 mL/min (ref >60)
Glucose, Bld: 99 mg/dL (ref 70–99)
Potassium: 4.2 mmol/L (ref 3.5–5.1)
Sodium: 141 mmol/L (ref 135–145)
Total Bilirubin: 0.4 mg/dL (ref 0.3–1.2)
Total Protein: 7.3 g/dL (ref 6.5–8.1)

## 2022-04-20 LAB — TSH: TSH: 1.002 u[IU]/mL (ref 0.350–4.500)

## 2022-04-20 LAB — CK: Total CK: 118 U/L (ref 38–234)

## 2022-04-20 NOTE — Patient Instructions (Signed)
Iliotibial Band Syndrome Rehab Ask your health care provider which exercises are safe for you. Do exercises exactly as told by your health care provider and adjust them as directed. It is normal to feel mild stretching, pulling, tightness, or discomfort as you do these exercises. Stop right away if you feel sudden pain or your pain gets significantly worse. Do not begin these exercises until told by your health care provider. Stretching and range-of-motion exercises These exercises warm up your muscles and joints and improve the movement and flexibility of your hip and pelvis. Quadriceps stretch, prone  Lie on your abdomen (prone position) on a firm surface, such as a bed or padded floor. Bend your left / right knee and reach back to hold your ankle or pant leg. If you cannot reach your ankle or pant leg, loop a belt around your foot and grab the belt instead. Gently pull your heel toward your buttocks. Your knee should not slide out to the side. You should feel a stretch in the front of your thigh and knee (quadriceps). Hold this position for __________ seconds. Repeat __________ times. Complete this exercise __________ times a day. Iliotibial band stretch An iliotibial band is a strong band of muscle tissue that runs from the outer side of your hip to the outer side of your thigh and knee. Lie on your side with your left / right leg in the top position. Bend both of your knees and grab your left / right ankle. Stretch out your bottom arm to help you balance. Slowly bring your top knee back so your thigh goes behind your trunk. Slowly lower your top leg toward the floor until you feel a gentle stretch on the outside of your left / right hip and thigh. If you do not feel a stretch and your knee will not fall farther, place the heel of your other foot on top of your knee and pull your knee down toward the floor with your foot. Hold this position for __________ seconds. Repeat __________ times.  Complete this exercise __________ times a day. Strengthening exercises These exercises build strength and endurance in your hip and pelvis. Endurance is the ability to use your muscles for a long time, even after they get tired. Straight leg raises, side-lying This exercise strengthens the muscles that rotate the leg at the hip and move it away from your body (hip abductors). Lie on your side with your left / right leg in the top position. Lie so your head, shoulder, hip, and knee line up. You may bend your bottom knee to help you balance. Roll your hips slightly forward so your hips are stacked directly over each other and your left / right knee is facing forward. Tense the muscles in your outer thigh and lift your top leg 4-6 inches (10-15 cm). Hold this position for __________ seconds. Slowly lower your leg to return to the starting position. Let your muscles relax completely before doing another repetition. Repeat __________ times. Complete this exercise __________ times a day. Leg raises, prone This exercise strengthens the muscles that move the hips backward (hip extensors). Lie on your abdomen (prone position) on your bed or a firm surface. You can put a pillow under your hips if that is more comfortable for your lower back. Bend your left / right knee so your foot is straight up in the air. Squeeze your buttocks muscles and lift your left / right thigh off the bed. Do not let your back arch. Tense   your thigh muscle as hard as you can without increasing any knee pain. Hold this position for __________ seconds. Slowly lower your leg to return to the starting position and allow it to relax completely. Repeat __________ times. Complete this exercise __________ times a day. Hip hike Stand sideways on a bottom step. Stand on your left / right leg with your other foot unsupported next to the step. You can hold on to a railing or wall for balance if needed. Keep your knees straight and your  torso square. Then lift your left / right hip up toward the ceiling. Slowly let your left / right hip lower toward the floor, past the starting position. Your foot should get closer to the floor. Do not lean or bend your knees. Repeat __________ times. Complete this exercise __________ times a day. This information is not intended to replace advice given to you by your health care provider. Make sure you discuss any questions you have with your health care provider. Document Revised: 07/11/2019 Document Reviewed: 07/11/2019 Elsevier Patient Education  2023 [employer]  

## 2022-04-21 LAB — ENA+DNA/DS+ANTICH+CENTRO+JO...
Anti JO-1: <0.2 AI (ref 0.0–0.9)
Centromere Ab Screen: <0.2 AI (ref 0.0–0.9)
Chromatin Ab SerPl-aCnc: <0.2 AI (ref 0.0–0.9)
ENA SM Ab Ser-aCnc: <0.2 AI (ref 0.0–0.9)
Ribonucleic Protein: 5.3 AI — ABNORMAL HIGH (ref 0.0–0.9)
SSA (Ro) (ENA) Antibody, IgG: <0.2 AI (ref 0.0–0.9)
SSB (La) (ENA) Antibody, IgG: 0.7 AI (ref 0.0–0.9)
Scleroderma (Scl-70) (ENA) Antibody, IgG: <0.2 AI (ref 0.0–0.9)
ds DNA Ab: <1 IU/mL (ref 0–9)

## 2022-04-21 LAB — ANA W/REFLEX IF POSITIVE: Anti Nuclear Antibody (ANA): POSITIVE — AB

## 2022-04-21 LAB — MISC LABCORP TEST (SEND OUT): Labcorp test code: 1917

## 2022-04-22 LAB — RHEUMATOID FACTOR: Rheumatoid fact SerPl-aCnc: 10.8 IU/mL (ref <14.0)

## 2022-04-23 LAB — MISC LABCORP TEST (SEND OUT): Labcorp test code: 520008

## 2022-04-25 NOTE — Progress Notes (Signed)
I will discuss results at the follow-up visit.

## 2022-04-29 ENCOUNTER — Encounter: Payer: Self-pay | Admitting: Internal Medicine

## 2022-04-29 ENCOUNTER — Ambulatory Visit: Payer: Self-pay | Attending: Internal Medicine | Admitting: Internal Medicine

## 2022-04-29 VITALS — BP 112/68 | HR 78 | Ht 67.0 in | Wt 156.6 lb

## 2022-04-29 DIAGNOSIS — Z8639 Personal history of other endocrine, nutritional and metabolic disease: Secondary | ICD-10-CM

## 2022-04-29 NOTE — Progress Notes (Signed)
Cardiology Office Note:    Date:  04/29/2022   ID:  Sally Fernandez, DOB [birth year], MRN [id number]  PCP:  Drue Flirt, MD   [facility] Providers Cardiologist:  Janina Mayo, MD     Referring MD: Drue Flirt, MD   No chief complaint on file. Chest pain  History of Present Illness:    Sally Fernandez is a 71 y.o. female with a hx of rheumatoid arthritis, former smoker quit in 1993, carotid artery dx, referral for chest pain  Blood pressure yesterday with primary provider was 110/60 mmHg. She was started on losartan 25 mg daily. Past SBP up to 130s.   She notes left sided chest pain that radiates to her back. She notes she saw a cardiologist in [place]. She was told she had CAD and insufficiency of a heart valve? No hx of cath. No stress testing.  Not on anything for heart rhythm. Once in awhile can get dizzy/woozy with low Bps. With normal Bps no symptoms. She fainted 5 years ago and she fainted. She had 24 cardiac monitor.No recommendation for PPM.   BP log shows predominantly normal Bps.  Interim Hx 04/29/2022 Today, she has some back pain.  Reviewed CT information. Otherwise no issues   Cardiovascular Studies Carotid duplex 09/15/21: Evidence of 60 to 79% right ICA stenosis and 40 to 59% left ICA stenosis  Past Medical History:  Diagnosis Date   High cholesterol    HTN (hypertension)     Past Surgical History:  Procedure Laterality Date   APPENDECTOMY     BREAST BIOPSY     TONSILLECTOMY      Current Medications: Current Meds  Medication Sig   aspirin EC 81 MG tablet Take 1 tablet (81 mg total) by mouth daily. Swallow whole.   atorvastatin (LIPITOR) 40 MG tablet Take 1 tablet (40 mg total) by mouth daily.   clopidogrel (PLAVIX) 75 MG tablet Take 1 tablet (75 mg total) by mouth daily.     Allergies:   Patient has no known allergies.   Social History   Socioeconomic History   Marital status: Single    Spouse name: Not on file   Number of  children: Not on file   Years of education: Not on file   Highest education level: Not on file  Occupational History   Not on file  Tobacco Use   Smoking status: Former    Packs/day: 0.10    Years: 5.00    Total pack years: 0.50    Types: Cigarettes    Quit date: 82    Years since quitting: 30.9    Passive exposure: Never   Smokeless tobacco: Never  Vaping Use   Vaping Use: Never used  Substance and Sexual Activity   Alcohol use: Yes    Comment: 1 per month   Drug use: Never   Sexual activity: Not on file  Other Topics Concern   Not on file  Social History Narrative   Not on file   Social Determinants of Health   Financial Resource Strain: Not on file  Food Insecurity: Not on file  Transportation Needs: Not on file  Physical Activity: Not on file  Stress: Not on file  Social Connections: Not on file     Family History: Brother has heart disease  ROS:   Please see the history of present illness.     All other systems reviewed and are negative.  EKGs/Labs/Other Studies Reviewed:    The following studies  were reviewed today:   EKG:  EKG is  ordered today.  The ekg ordered today demonstrates   10/27/2021- NSR, no q waves  Recent Labs: 04/20/2022: ALT 23; BUN 12; Creatinine, Ser 0.73; Hemoglobin 12.5; Platelets 225; Potassium 4.2; Sodium 141; TSH 1.002   Recent Lipid Panel    Component Value Date/Time   CHOL 150 12/11/2021 0956   TRIG 73 12/11/2021 0956   HDL 50 12/11/2021 0956   CHOLHDL 3.0 12/11/2021 0956   LDLCALC 86 12/11/2021 0956     Risk Assessment/Calculations:           Physical Exam:    VS:    Vitals:   04/29/22 1404  BP: 112/68  Pulse: 78  SpO2: 98%     Wt Readings from Last 3 Encounters:  04/29/22 156 lb 9.6 oz (71 kg)  04/20/22 155 lb (70.3 kg)  03/23/22 153 lb (69.4 kg)     GEN:  Well nourished, well developed in no acute distress HEENT: Normal NECK: No JVD; No carotid bruits LYMPHATICS: No lymphadenopathy CARDIAC:  RRR, no murmurs, rubs, gallops RESPIRATORY:  Clear to auscultation without rales, wheezing or rhonchi  ABDOMEN: Soft, non-tender, non-distended MUSCULOSKELETAL:  No edema; No deformity  SKIN: Warm and dry NEUROLOGIC:  Alert and oriented x 3 PSYCHIATRIC:  Normal affect   ASSESSMENT:    Chest Pain: She notes intermittent L sided chest tightness and ?hx of CAD. With risk including age and carotid dx , as well as former smoking CTA showed negative FFR. TTE EF 55-60%, nl RV, no valve dx.  HTN: well controlled at home. Continue losartan 25 mg daily.  Carotid Artery Dx: no symptoms. She has no hx of CVA/TIA.Continue statin and plavix 75 mg daily ( started in [place]). Followed by vascular LDL 86 mg/dL  PLAN:    In order of problems listed above:   No changes Fasting lipid profile Follow up 12 months      Medication Adjustments/Labs and Tests Ordered: Current medicines are reviewed at length with the patient today.  Concerns regarding medicines are outlined above.  Orders Placed This Encounter  Procedures   Lipid Profile   No orders of the defined types were placed in this encounter.   Patient Instructions  Medication Instructions:  Your physician recommends that you continue on your current medications as directed. Please refer to the Current Medication list given to you today.  *If you need a refill on your cardiac medications before your next appointment, please call your pharmacy*   Lab Work: Your physician recommends that you return tomorrow to have the following lab drawn: Lipid Profile.  If you have labs (blood work) drawn today and your tests are completely normal, you will receive your results only by: [place] (if you have MyChart) OR A paper copy in the mail If you have any lab test that is abnormal or we need to change your treatment, we will call you to review the results.   Testing/Procedures: NONE   Follow-Up: At [facility], you and your  health needs are our priority.  As part of our continuing mission to provide you with exceptional heart care, we have created designated Provider Care Teams.  These Care Teams include your primary Cardiologist (physician) and Advanced Practice Providers (APPs -  Physician Assistants and Nurse Practitioners) who all work together to provide you with the care you need, when you need it.  We recommend signing up for the patient portal called "MyChart".  Sign up  information is provided on this After Visit Summary.  MyChart is used to connect with patients for Virtual Visits (Telemedicine).  Patients are able to view lab/test results, encounter notes, upcoming appointments, etc.  Non-urgent messages can be sent to your provider as well.   To learn more about what you can do with MyChart, go to [email or web address].    Your next appointment:   1 year(s)  The format for your next appointment:   In Person  Provider:   Maisie Fus, MD     Signed, Maisie Fus, MD  04/29/2022 2:26 PM    [facility]

## 2022-04-29 NOTE — Patient Instructions (Signed)
Medication Instructions:  Your physician recommends that you continue on your current medications as directed. Please refer to the Current Medication list given to you today.  *If you need a refill on your cardiac medications before your next appointment, please call your pharmacy*   Lab Work: Your physician recommends that you return tomorrow to have the following lab drawn: Lipid Profile.  If you have labs (blood work) drawn today and your tests are completely normal, you will receive your results only by: MyChart Message (if you have MyChart) OR A paper copy in the mail If you have any lab test that is abnormal or we need to change your treatment, we will call you to review the results.   Testing/Procedures: NONE   Follow-Up: At [facility], you and your health needs are our priority.  As part of our continuing mission to provide you with exceptional heart care, we have created designated Provider Care Teams.  These Care Teams include your primary Cardiologist (physician) and Advanced Practice Providers (APPs -  Physician Assistants and Nurse Practitioners) who all work together to provide you with the care you need, when you need it.  We recommend signing up for the patient portal called "MyChart".  Sign up information is provided on this After Visit Summary.  MyChart is used to connect with patients for Virtual Visits (Telemedicine).  Patients are able to view lab/test results, encounter notes, upcoming appointments, etc.  Non-urgent messages can be sent to your provider as well.   To learn more about what you can do with MyChart, go to [email or web address].    Your next appointment:   1 year(s)  The format for your next appointment:   In Person  Provider:   Maisie Fus, MD

## 2022-04-30 ENCOUNTER — Other Ambulatory Visit: Payer: Self-pay

## 2022-04-30 DIAGNOSIS — Z8639 Personal history of other endocrine, nutritional and metabolic disease: Secondary | ICD-10-CM

## 2022-04-30 LAB — LIPID PANEL
Chol/HDL Ratio: 2.8 ratio (ref 0.0–4.4)
Cholesterol, Total: 153 mg/dL (ref 100–199)
HDL: 55 mg/dL (ref >39)
LDL Chol Calc (NIH): 85 mg/dL (ref 0–99)
Triglycerides: 66 mg/dL (ref 0–149)
VLDL Cholesterol Cal: 13 mg/dL (ref 5–40)

## 2022-05-05 ENCOUNTER — Other Ambulatory Visit: Payer: Self-pay

## 2022-05-05 ENCOUNTER — Telehealth: Payer: Self-pay | Admitting: Internal Medicine

## 2022-05-05 MED ORDER — ATORVASTATIN CALCIUM 80 MG PO TABS: 80.0000 mg | ORAL_TABLET | Freq: Every day | ORAL | 3 refills | Status: AC

## 2022-05-05 NOTE — Telephone Encounter (Signed)
Pt's daughter calling to confirm that pharmacy on file for pt is the correct pharmacy. She would like a callback once medication refills have been sent. Please advise

## 2022-05-05 NOTE — Progress Notes (Signed)
Office Visit Note  Patient: Sally Fernandez             Date of Birth: 15-Apr-1951           MRN: [id number]             PCP: Drue Flirt, MD Referring: Drue Flirt, MD Visit Date: 05/18/2022 Occupation: _0 @  Interpreter: Jiles Garter (daughter)  Subjective:  Pain in multiple joints  History of Present Illness: Sally Fernandez is a 71 y.o. female accompanied by her daughter Jiles Garter as her interpreter.  According to the patient she continues to have pain and discomfort in multiple joints.  She states the worst pain is in her back which radiates into her left lower extremity.  She also has pain in her both hips especially the left side.  Which she describes over the trochanteric region.  She has discomfort in her left knee and her bilateral hands.  She has not noticed any joint swelling.  She denies history of oral ulcers, nasal ulcers, malar rash, photosensitivity, Raynaud's or lymphadenopathy.    Activities of Daily Living:  Patient reports morning stiffness for 1.5-2 hours.   Patient Reports nocturnal pain.  Difficulty dressing/grooming: Denies Difficulty climbing stairs: Reports Difficulty getting out of chair: Denies Difficulty using hands for taps, buttons, cutlery, and/or writing: Denies  Review of Systems  Constitutional:  Positive for fatigue.  HENT:  Negative for mouth sores and mouth dryness.   Eyes:  Positive for dryness.  Respiratory:  Negative for shortness of breath.   Cardiovascular:  Negative for chest pain and palpitations.  Gastrointestinal:  Positive for constipation. Negative for blood in stool and diarrhea.  Endocrine: Negative for increased urination.  Genitourinary:  Positive for nocturia. Negative for involuntary urination.  Musculoskeletal:  Positive for joint pain, joint pain, myalgias, muscle weakness, morning stiffness, muscle tenderness and myalgias. Negative for gait problem and joint swelling.  Skin:  Negative for color change, rash,  hair loss and sensitivity to sunlight.  Allergic/Immunologic: Negative for susceptible to infections.  Neurological:  Positive for headaches. Negative for dizziness.  Hematological:  Negative for swollen glands.  Psychiatric/Behavioral:  Positive for sleep disturbance. Negative for depressed mood. The patient is nervous/anxious.     PMFS History:  Patient Active Problem List   Diagnosis Date Noted   DDD (degenerative disc disease), lumbar 05/18/2022   Primary osteoarthritis of both hands 05/18/2022   Primary osteoarthritis of right hip 05/18/2022   Primary osteoarthritis of left knee 05/18/2022   Primary osteoarthritis of both feet 05/18/2022   Essential hypertension 04/20/2022   History of hyperlipidemia 04/20/2022   Carotid stenosis 09/18/2021    Past Medical History:  Diagnosis Date   High cholesterol    HTN (hypertension)     Family History  Problem Relation Age of Onset   Stomach cancer Mother    Stroke Mother    Tuberculosis Father    Past Surgical History:  Procedure Laterality Date   APPENDECTOMY     BREAST BIOPSY     TONSILLECTOMY     Social History   Social History Narrative   Not on file    There is no immunization history on file for this patient.   Objective: Vital Signs: BP 135/80 (BP Location: Left Arm, Patient Position: Sitting, Cuff Size: Normal)   Pulse 76   Resp 15   Ht _1  (1.702 m)   Wt 156 lb (70.8 kg)   BMI 24.43 kg/m    Physical Exam Vitals and  nursing note reviewed.  Constitutional:      Appearance: She is well-developed.  HENT:     Head: Normocephalic and atraumatic.  Eyes:     Conjunctiva/sclera: Conjunctivae normal.  Cardiovascular:     Rate and Rhythm: Normal rate and regular rhythm.     Heart sounds: Normal heart sounds.  Pulmonary:     Effort: Pulmonary effort is normal.     Breath sounds: Normal breath sounds.  Abdominal:     General: Bowel sounds are normal.     Palpations: Abdomen is soft.  Musculoskeletal:      Cervical back: Normal range of motion.  Lymphadenopathy:     Cervical: No cervical adenopathy.  Skin:    General: Skin is warm and dry.     Capillary Refill: Capillary refill takes less than 2 seconds.  Neurological:     Mental Status: She is alert and oriented to person, place, and time.  Psychiatric:        Behavior: Behavior normal.      Musculoskeletal Exam: He had limited lateral rotation of the cervical spine without discomfort.  She had painful range of motion of her lumbar spine with left-sided radiculopathy.  She had lumbar scoliosis.  Shoulder joints, elbow joints, wrist joints with good range of motion.  She had bilateral PIP and DIP thickening with no synovitis.  Hip joints had limited range of motion with tenderness over left trochanteric bursa.  Knee joints in good range of motion without any warmth swelling or effusion.  There was no tenderness over ankles or MTPs.  CDAI Exam: CDAI Score: -- Patient Global: --; Provider Global: -- Swollen: --; Tender: -- Joint Exam 05/18/2022   No joint exam has been documented for this visit   There is currently no information documented on the homunculus. Go to the Rheumatology activity and complete the homunculus joint exam.  Investigation: No additional findings.  Imaging: XR Lumbar Spine 2-3 Views  Result Date: 04/20/2022 Dextroscoliosis was noted.  Multilevel spondylosis with severe narrowing between L2-L3, L3-L4, L4-L5 was noted.  Facet joint arthropathy with foraminal narrowing was noted. Impression: These findings are consistent with dextroscoliosis, multilevel severe spondylosis and facet joint arthropathy.  XR Hand 2 View Left  Result Date: 04/20/2022 CMC, PIP and DIP narrowing was noted.  No MCP, intercarpal radiocarpal joint space narrowing was noted.  No erosive changes were noted. Impression: These findings are consistent with osteoarthritis of the hand.  XR Hand 2 View Right  Result Date: 04/20/2022 CMC, PIP and  DIP narrowing was noted.  No MCP, intercarpal radiocarpal joint space narrowing was noted.  No erosive changes were noted. Impression: These findings are consistent with osteoarthritis of the hand.  XR KNEE 3 VIEW LEFT  Result Date: 04/20/2022 Moderate medial compartment narrowing with medial and intercondylar osteophytes were noted.  Moderate patellofemoral narrowing was noted.  Possible chondrocalcinosis was noted. 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  XR Foot 2 Views Left  Result Date: 04/20/2022 First MTP narrowing with subchondral sclerosis was noted.  PIP and DIP narrowing was noted.  No intertarsal, tibiotalar or subtalar joint space narrowing was noted.  No erosive changes were noted. Impression: These findings are consistent with osteoarthritis of the foot.  XR Foot 2 Views Right  Result Date: 04/20/2022 Severe first MTP narrowing with subchondral sclerosis and spurring was noted.  PIP and DIP narrowing was noted.  No intertarsal, tibiotalar or subtalar joint space narrowing was noted.  Inferior calcaneal spur was noted.  No erosive changes were noted. Impression: These findings were consistent with osteoarthritis of the foot.  XR HIP UNILAT W OR W/O PELVIS 2-3 VIEWS RIGHT  Result Date: 04/20/2022 Moderate hip joint narrowing with subchondral sclerosis and spurring was noted. Impression: These findings are consistent with moderate osteoarthritis of the hip joint.   Recent Labs: Lab Results  Component Value Date   WBC 6.8 04/20/2022   HGB 12.5 04/20/2022   PLT 225 04/20/2022   NA 141 04/20/2022   K 4.2 04/20/2022   CL 106 04/20/2022   CO2 28 04/20/2022   GLUCOSE 99 04/20/2022   BUN 12 04/20/2022   CREATININE 0.73 04/20/2022   BILITOT 0.4 04/20/2022   ALKPHOS 82 04/20/2022   AST 25 04/20/2022   ALT 23 04/20/2022   PROT 7.3 04/20/2022   ALBUMIN 3.9 04/20/2022   CALCIUM 9.4  04/20/2022   April 20, 2022 TSH normal, CK118, ESR 36, RF negative, ANA positive, RNP 5.3, (dsDNA SSA, SSB, SCL 70, Smith, chromatin, Jo 1 negative) Speciality Comments: No specialty comments available.  Procedures:  No procedures performed Allergies: Patient has no known allergies.   Assessment / Plan:     Visit Diagnoses: Positive ANA (antinuclear antibody) - +ANA, no titer, +RNP, elevated sed rate.  Lab results were discussed with the patient and her daughter at length.  Patient had positive rheumatoid factor in the past which is negative now.  She had no synovitis on the examination.  Her ANA is positive but no titer was given.  Patient will have repeat ANA titer.  RNP is positive.  There is no history of oral ulcers, nasal ulcers, malar rash, photosensitivity, Raynaud's, lymphadenopathy.  I would like to repeat the labs when she comes back from [place].  She is going to return in 1 month.  I advised her to have repeat ANA, RNP and sed rate prior to her next visit in 6 months.  DDD (degenerative disc disease), lumbar-she has severe disc disease of the lumbar spine and also scoliosis.  X-ray findings were discussed with the patient.  She complains of left-sided radiculopathy.  I will refer her to Dr. Laurance Flatten for evaluation of degenerative disc disease and radiculopathy.  I also gave her a handout on back muscle strengthening exercises.  Primary osteoarthritis of both hands-she continues to have discomfort in her hands.  No synovitis was noted.  Bilateral PIP and DIP thickening was noted.  X-rays showed bilateral osteoarthritis.  X-ray findings were discussed with the patient.  Joint protection was discussed.  Primary osteoarthritis of right hip-she has limited range of motion of bilateral hip joints.  X-rays of the right hip joint showed moderate osteoarthritis.  X-ray findings were discussed with the patient.  I will refer her to orthopedics for evaluation.  She had tenderness over left  trochanteric bursa.  She had cortisone injection in the past which was helpful.  IT band stretches were given.  Primary osteoarthritis of left knee-she continues to have discomfort in her knee joints.  X-rays showed moderate osteoarthritis and moderate chondromalacia patella.  Possible chondrocalcinosis was noted.  She had no warmth swelling or effusion today.  Primary osteoarthritis of both feet-x-rays showed osteoarthritis in bilateral feet.  She has chronic discomfort.  No synovitis was noted.  Proper fitting shoes were advised.  Other medical problems are listed as follows:  Dysphagia-according to the patientshe does not have any symptoms of dysphagia now.  Other fatigue  History of osteoporosis  Essential  hypertension  Left carotid artery stenosis  History of hyperlipidemia  Language barrier-patient's daughter was the interpreter.  Patient speaks [place].  Orders: Orders Placed This Encounter  Procedures   AMB referral to orthopedics   No orders of the defined types were placed in this encounter.   Follow-Up Instructions: Return in about 6 months (around 11/16/2022) for +ANA, +RNP.   Bo Merino, MD  Note - This record has been created using Editor, commissioning.  Chart creation errors have been sought, but may not always  have been located. Such creation errors do not reflect on  the standard of medical care.

## 2022-05-05 NOTE — Telephone Encounter (Signed)
Called daughter, verbalized pharmacy.   Sent updated RX to pharmacy- see results.

## 2022-05-18 ENCOUNTER — Ambulatory Visit: Payer: No Typology Code available for payment source | Attending: Rheumatology | Admitting: Rheumatology

## 2022-05-18 ENCOUNTER — Encounter: Payer: Self-pay | Admitting: Rheumatology

## 2022-05-18 ENCOUNTER — Other Ambulatory Visit (HOSPITAL_COMMUNITY)
Admission: RE | Admit: 2022-05-18 | Discharge: 2022-05-18 | Disposition: A | Discharge status: Home / Self Care | Payer: Self-pay | Source: Other Acute Inpatient Hospital | Attending: Rheumatology | Admitting: Rheumatology

## 2022-05-18 VITALS — BP 135/80 | HR 76 | Resp 15 | Ht 67.0 in | Wt 156.0 lb

## 2022-05-18 DIAGNOSIS — M19071 Primary osteoarthritis, right ankle and foot: Secondary | ICD-10-CM | POA: Insufficient documentation

## 2022-05-18 DIAGNOSIS — E785 Hyperlipidemia, unspecified: Secondary | ICD-10-CM | POA: Insufficient documentation

## 2022-05-18 DIAGNOSIS — M1611 Unilateral primary osteoarthritis, right hip: Secondary | ICD-10-CM | POA: Insufficient documentation

## 2022-05-18 DIAGNOSIS — Z8739 Personal history of other diseases of the musculoskeletal system and connective tissue: Secondary | ICD-10-CM

## 2022-05-18 DIAGNOSIS — M19042 Primary osteoarthritis, left hand: Secondary | ICD-10-CM | POA: Insufficient documentation

## 2022-05-18 DIAGNOSIS — I6522 Occlusion and stenosis of left carotid artery: Secondary | ICD-10-CM

## 2022-05-18 DIAGNOSIS — Z8639 Personal history of other endocrine, nutritional and metabolic disease: Secondary | ICD-10-CM

## 2022-05-18 DIAGNOSIS — M19072 Primary osteoarthritis, left ankle and foot: Secondary | ICD-10-CM

## 2022-05-18 DIAGNOSIS — I10 Essential (primary) hypertension: Secondary | ICD-10-CM | POA: Insufficient documentation

## 2022-05-18 DIAGNOSIS — Z789 Other specified health status: Secondary | ICD-10-CM

## 2022-05-18 DIAGNOSIS — R1319 Other dysphagia: Secondary | ICD-10-CM

## 2022-05-18 DIAGNOSIS — M1712 Unilateral primary osteoarthritis, left knee: Secondary | ICD-10-CM | POA: Insufficient documentation

## 2022-05-18 DIAGNOSIS — M19041 Primary osteoarthritis, right hand: Secondary | ICD-10-CM | POA: Insufficient documentation

## 2022-05-18 DIAGNOSIS — R5383 Other fatigue: Secondary | ICD-10-CM

## 2022-05-18 DIAGNOSIS — M5136 Other intervertebral disc degeneration, lumbar region: Secondary | ICD-10-CM | POA: Insufficient documentation

## 2022-05-18 DIAGNOSIS — R768 Other specified abnormal immunological findings in serum: Secondary | ICD-10-CM

## 2022-05-18 DIAGNOSIS — M419 Scoliosis, unspecified: Secondary | ICD-10-CM | POA: Insufficient documentation

## 2022-05-18 NOTE — Patient Instructions (Signed)
Back Exercises The following exercises strengthen the muscles that help to support the trunk (torso) and back. They also help to keep the lower back flexible. Doing these exercises can help to prevent or lessen existing low back pain. If you have back pain or discomfort, try doing these exercises 2-3 times each day or as told by your health care provider. As your pain improves, do them once each day, but increase the number of times that you repeat the steps for each exercise (do more repetitions). To prevent the recurrence of back pain, continue to do these exercises once each day or as told by your health care provider. Do exercises exactly as told by your health care provider and adjust them as directed. It is normal to feel mild stretching, pulling, tightness, or discomfort as you do these exercises, but you should stop right away if you feel sudden pain or your pain gets worse. Exercises Single knee to chest Repeat these steps 3-5 times for each leg: Lie on your back on a firm bed or the floor with your legs extended. Bring one knee to your chest. Your other leg should stay extended and in contact with the floor. Hold your knee in place by grabbing your knee or thigh with both hands and hold. Pull on your knee until you feel a gentle stretch in your lower back or buttocks. Hold the stretch for 10-30 seconds. Slowly release and straighten your leg.  Pelvic tilt Repeat these steps 5-10 times: Lie on your back on a firm bed or the floor with your legs extended. Bend your knees so they are pointing toward the ceiling and your feet are flat on the floor. Tighten your lower abdominal muscles to press your lower back against the floor. This motion will tilt your pelvis so your tailbone points up toward the ceiling instead of pointing to your feet or the floor. With gentle tension and even breathing, hold this position for 5-10 seconds.  Cat-cow Repeat these steps until your lower back becomes  more flexible: Get into a hands-and-knees position on a firm bed or the floor. Keep your hands under your shoulders, and keep your knees under your hips. You may place padding under your knees for comfort. Let your head hang down toward your chest. Contract your abdominal muscles and point your tailbone toward the floor so your lower back becomes rounded like the back of a cat. Hold this position for 5 seconds. Slowly lift your head, let your abdominal muscles relax, and point your tailbone up toward the ceiling so your back forms a sagging arch like the back of a cow. Hold this position for 5 seconds.  Press-ups Repeat these steps 5-10 times: Lie on your abdomen (face-down) on a firm bed or the floor. Place your palms near your head, about shoulder-width apart. Keeping your back as relaxed as possible and keeping your hips on the floor, slowly straighten your arms to raise the top half of your body and lift your shoulders. Do not use your back muscles to raise your upper torso. You may adjust the placement of your hands to make yourself more comfortable. Hold this position for 5 seconds while you keep your back relaxed. Slowly return to lying flat on the floor.  Bridges Repeat these steps 10 times: Lie on your back on a firm bed or the floor. Bend your knees so they are pointing toward the ceiling and your feet are flat on the floor. Your arms should be flat   at your sides, next to your body. Tighten your buttocks muscles and lift your buttocks off the floor until your waist is at almost the same height as your knees. You should feel the muscles working in your buttocks and the back of your thighs. If you do not feel these muscles, slide your feet 1-2 inches (2.5-5 cm) farther away from your buttocks. Hold this position for 3-5 seconds. Slowly lower your hips to the starting position, and allow your buttocks muscles to relax completely. If this exercise is too easy, try doing it with your arms  crossed over your chest. Abdominal crunches Repeat these steps 5-10 times: Lie on your back on a firm bed or the floor with your legs extended. Bend your knees so they are pointing toward the ceiling and your feet are flat on the floor. Cross your arms over your chest. Tip your chin slightly toward your chest without bending your neck. Tighten your abdominal muscles and slowly raise your torso high enough to lift your shoulder blades a tiny bit off the floor. Avoid raising your torso higher than that because it can put too much stress on your lower back and does not help to strengthen your abdominal muscles. Slowly return to your starting position.  Back lifts Repeat these steps 5-10 times: Lie on your abdomen (face-down) with your arms at your sides, and rest your forehead on the floor. Tighten the muscles in your legs and your buttocks. Slowly lift your chest off the floor while you keep your hips pressed to the floor. Keep the back of your head in line with the curve in your back. Your eyes should be looking at the floor. Hold this position for 3-5 seconds. Slowly return to your starting position.  Contact a health care provider if: Your back pain or discomfort gets much worse when you do an exercise. Your worsening back pain or discomfort does not lessen within 2 hours after you exercise. If you have any of these problems, stop doing these exercises right away. Do not do them again unless your health care provider says that you can. Get help right away if: You develop sudden, severe back pain. If this happens, stop doing the exercises right away. Do not do them again unless your health care provider says that you can. This information is not intended to replace advice given to you by your health care provider. Make sure you discuss any questions you have with your health care provider. Document Revised: 10/28/2020 Document Reviewed: 07/16/2020 Else[employer] Patient Education  2023 [employer]   Iliotibial Band Syndrome Rehab Ask your health care provider which exercises are safe for you. Do exercises exactly as told by your health care provider and adjust them as directed. It is normal to feel mild stretching, pulling, tightness, or discomfort as you do these exercises. Stop right away if you feel sudden pain or your pain gets significantly worse. Do not begin these exercises until told by your health care provider. Stretching and range-of-motion exercises These exercises warm up your muscles and joints and improve the movement and flexibility of your hip and pelvis. Quadriceps stretch, prone  Lie on your abdomen (prone position) on a firm surface, such as a bed or padded floor. Bend your left / right knee and reach back to hold your ankle or pant leg. If you cannot reach your ankle or pant leg, loop a belt around your foot and grab the belt instead. Gently pull your heel toward your buttocks.   Your knee should not slide out to the side. You should feel a stretch in the front of your thigh and knee (quadriceps). Hold this position for __________ seconds. Repeat __________ times. Complete this exercise __________ times a day. Iliotibial band stretch An iliotibial band is a strong band of muscle tissue that runs from the outer side of your hip to the outer side of your thigh and knee. Lie on your side with your left / right leg in the top position. Bend both of your knees and grab your left / right ankle. Stretch out your bottom arm to help you balance. Slowly bring your top knee back so your thigh goes behind your trunk. Slowly lower your top leg toward the floor until you feel a gentle stretch on the outside of your left / right hip and thigh. If you do not feel a stretch and your knee will not fall farther, place the heel of your other foot on top of your knee and pull your knee down toward the floor with your foot. Hold this position for __________ seconds. Repeat __________  times. Complete this exercise __________ times a day. Strengthening exercises These exercises build strength and endurance in your hip and pelvis. Endurance is the ability to use your muscles for a long time, even after they get tired. Straight leg raises, side-lying This exercise strengthens the muscles that rotate the leg at the hip and move it away from your body (hip abductors). Lie on your side with your left / right leg in the top position. Lie so your head, shoulder, hip, and knee line up. You may bend your bottom knee to help you balance. Roll your hips slightly forward so your hips are stacked directly over each other and your left / right knee is facing forward. Tense the muscles in your outer thigh and lift your top leg 4-6 inches (10-15 cm). Hold this position for __________ seconds. Slowly lower your leg to return to the starting position. Let your muscles relax completely before doing another repetition. Repeat __________ times. Complete this exercise __________ times a day. Leg raises, prone This exercise strengthens the muscles that move the hips backward (hip extensors). Lie on your abdomen (prone position) on your bed or a firm surface. You can put a pillow under your hips if that is more comfortable for your lower back. Bend your left / right knee so your foot is straight up in the air. Squeeze your buttocks muscles and lift your left / right thigh off the bed. Do not let your back arch. Tense your thigh muscle as hard as you can without increasing any knee pain. Hold this position for __________ seconds. Slowly lower your leg to return to the starting position and allow it to relax completely. Repeat __________ times. Complete this exercise __________ times a day. Hip hike Stand sideways on a bottom step. Stand on your left / right leg with your other foot unsupported next to the step. You can hold on to a railing or wall for balance if needed. Keep your knees straight and  your torso square. Then lift your left / right hip up toward the ceiling. Slowly let your left / right hip lower toward the floor, past the starting position. Your foot should get closer to the floor. Do not lean or bend your knees. Repeat __________ times. Complete this exercise __________ times a day. This information is not intended to replace advice given to you by your health care provider. Make   sure you discuss any questions you have with your health care provider. Document Revised: 07/11/2019 Document Reviewed: 07/11/2019 Elsevier Patient Education  2023 [employer]  

## 2022-05-19 ENCOUNTER — Other Ambulatory Visit: Payer: Self-pay

## 2022-05-19 ENCOUNTER — Other Ambulatory Visit: Payer: Self-pay | Admitting: Vascular Surgery

## 2022-05-19 DIAGNOSIS — I6523 Occlusion and stenosis of bilateral carotid arteries: Secondary | ICD-10-CM

## 2022-05-19 LAB — ENA+DNA/DS+ANTICH+CENTRO+JO...
Anti JO-1: <0.2 AI (ref 0.0–0.9)
Centromere Ab Screen: <0.2 AI (ref 0.0–0.9)
Chromatin Ab SerPl-aCnc: <0.2 AI (ref 0.0–0.9)
ENA SM Ab Ser-aCnc: <0.2 AI (ref 0.0–0.9)
Ribonucleic Protein: 5.2 AI — ABNORMAL HIGH (ref 0.0–0.9)
SSA (Ro) (ENA) Antibody, IgG: <0.2 AI (ref 0.0–0.9)
SSB (La) (ENA) Antibody, IgG: 0.6 AI (ref 0.0–0.9)
Scleroderma (Scl-70) (ENA) Antibody, IgG: <0.2 AI (ref 0.0–0.9)
ds DNA Ab: <1 IU/mL (ref 0–9)

## 2022-05-19 LAB — ANA W/REFLEX IF POSITIVE: Anti Nuclear Antibody (ANA): POSITIVE — AB

## 2022-05-19 MED ORDER — CLOPIDOGREL BISULFATE 75 MG PO TABS: 75.0000 mg | ORAL_TABLET | Freq: Every day | ORAL | 6 refills | Status: AC

## 2022-05-20 ENCOUNTER — Ambulatory Visit (INDEPENDENT_AMBULATORY_CARE_PROVIDER_SITE_OTHER): Payer: Self-pay | Admitting: Orthopedic Surgery

## 2022-05-20 ENCOUNTER — Ambulatory Visit (INDEPENDENT_AMBULATORY_CARE_PROVIDER_SITE_OTHER): Payer: No Typology Code available for payment source

## 2022-05-20 ENCOUNTER — Encounter: Payer: Self-pay | Admitting: Orthopedic Surgery

## 2022-05-20 VITALS — BP 114/71 | HR 76 | Ht 67.0 in | Wt 156.0 lb

## 2022-05-20 DIAGNOSIS — M5416 Radiculopathy, lumbar region: Secondary | ICD-10-CM

## 2022-05-20 DIAGNOSIS — M5136 Other intervertebral disc degeneration, lumbar region: Secondary | ICD-10-CM

## 2022-05-20 NOTE — Progress Notes (Signed)
Orthopedic Spine Surgery Office Note  Assessment: Patient is a 72 y.o. female with low back pain that radiates into her left leg in an L5 or S1 distribution. Had pain with hip range of motion, so likely a component of pain from hip OA   Plan: -Explained that initially conservative treatment is tried as a significant number of patients may experience relief with these treatment modalities. Discussed that the conservative treatments include:  -activity modification  -physical therapy  -over the counter pain medications  -medrol dosepak  -lumbar steroid injections -Patient has tried activity modification, intramuscular steroid  -Recommended MRI of the lumbar spine to evaluate for radiculopathy -Patient should return to office in 3 weeks, x-rays at next visit: none   Patient expressed understanding of the plan and all questions were answered to the patient's satisfaction.   ___________________________________________________________________________   History:  Patient is a 72 y.o. female who presents today for lumbar spine. Patient has had several years of low back pain that radiates into her left leg. Feels it on the lateral aspect of the hip and into the left lateral thigh. It then goes into the left lateral leg and into the dorsal and lateral aspect of the foot. There was no trauma or injury that brought on the pain. No right sided symptoms. Pain is felt on a daily basis and has been getting worse with time. No particular activities that seem to make it better or worse.     Weakness: yes, left leg feels weaker at times. No other weakness noted Symptoms of imbalance: denies Paresthesias and numbness: denies Bowel or bladder incontinence: denies Saddle anesthesia: denies  Treatments tried: activity modification, intramuscular steroid  Review of systems: Denies fevers and chills, night sweats, unexplained weight loss, history of cancer. Has had pain that wakes her at night  Past  medical history: HLD CAD Osteoporosis Anxiety RA PAD Chronic pain  Allergies: NKDA  Past surgical history:  Appendectomy Tonsillectomy  Social history: Denies use of nicotine product (smoking, vaping, patches, smokeless) Alcohol use: yes, 1 drink per week Denies recreational drug use   Physical Exam:  General: no acute distress, appears stated age Neurologic: alert, answering questions appropriately, following commands Respiratory: unlabored breathing on room air, symmetric chest rise Psychiatric: appropriate affect, normal cadence to speech   MSK (spine):  -Strength exam      Left  Right EHL    4/5  4/5 TA    5/5  5/5 GSC    5/5  5/5 Knee extension  5/5  5/5 Hip flexion   5/5  5/5  -Sensory exam    Sensation intact to light touch in L3-S1 nerve distributions of bilateral lower extremities  -Achilles DTR: 1/4 on the left, 1/4 on the right -Patellar tendon DTR: 2/4 on the left, 2/4 on the right  -Straight leg raise: negative -Contralateral straight leg raise: negative -Femoral nerve stretch test: negative -Clonus: no beats bilaterally  -Left hip exam: positive FADIR, pain with maximum external rotation, no pain through remainder of motion at hip, positive stinchfield -Right hip exam: no pain through range of motion, negative stinchfield   Imaging: XR of the lumbar spine from 04/20/2022 and 05/20/2022 was independently reviewed and interpreted, showing bilateral hip OA with joint space narrowing and subchondral sclerosis. Lumbar degenerative scoliosis with apex to the right. Disc height loss at multiple levels. No anterolisthesis seen.    Patient name: Sally Fernandez Patient MRN: [id number] Date of visit: 05/20/22

## 2022-06-09 ENCOUNTER — Ambulatory Visit: Payer: No Typology Code available for payment source | Admitting: Orthopedic Surgery

## 2023-02-14 NOTE — Progress Notes (Deleted)
Office Visit Note  Patient: Sally Fernandez             Date of Birth: 02/24/1951           MRN: [id number]             PCP: Verlon Au, MD Referring: Verlon Au, MD Visit Date: 02/28/2023 Occupation: @GUAROCC @  Subjective:  No chief complaint on file.   History of Present Illness: Sally Fernandez is a 72 y.o. female ***     Activities of Daily Living:  Patient reports morning stiffness for *** {minute/hour:19697}.   Patient {ACTIONS;DENIES/REPORTS:21021675::"Denies"} nocturnal pain.  Difficulty dressing/grooming: {ACTIONS;DENIES/REPORTS:21021675::"Denies"} Difficulty climbing stairs: {ACTIONS;DENIES/REPORTS:21021675::"Denies"} Difficulty getting out of chair: {ACTIONS;DENIES/REPORTS:21021675::"Denies"} Difficulty using hands for taps, buttons, cutlery, and/or writing: {ACTIONS;DENIES/REPORTS:21021675::"Denies"}  No Rheumatology ROS completed.   PMFS History:  Patient Active Problem List   Diagnosis Date Noted   DDD (degenerative disc disease), lumbar 05/18/2022   Primary osteoarthritis of both hands 05/18/2022   Primary osteoarthritis of right hip 05/18/2022   Primary osteoarthritis of left knee 05/18/2022   Primary osteoarthritis of both feet 05/18/2022   Essential hypertension 04/20/2022   History of hyperlipidemia 04/20/2022   Carotid stenosis 09/18/2021    Past Medical History:  Diagnosis Date   High cholesterol    HTN (hypertension)     Family History  Problem Relation Age of Onset   Stomach cancer Mother    Stroke Mother    Tuberculosis Father    Past Surgical History:  Procedure Laterality Date   APPENDECTOMY     BREAST BIOPSY     TONSILLECTOMY     Social History   Social History Narrative   Not on file    There is no immunization history on file for this patient.   Objective: Vital Signs: There were no vitals taken for this visit.   Physical Exam   Musculoskeletal Exam: ***  CDAI Exam: CDAI Score: -- Patient  Global: --; Provider Global: -- Swollen: --; Tender: -- Joint Exam 02/28/2023   No joint exam has been documented for this visit   There is currently no information documented on the homunculus. Go to the Rheumatology activity and complete the homunculus joint exam.  Investigation: No additional findings.  Imaging: No results found.  Recent Labs: Lab Results  Component Value Date   WBC 6.8 04/20/2022   HGB 12.5 04/20/2022   PLT 225 04/20/2022   NA 141 04/20/2022   K 4.2 04/20/2022   CL 106 04/20/2022   CO2 28 04/20/2022   GLUCOSE 99 04/20/2022   BUN 12 04/20/2022   CREATININE 0.73 04/20/2022   BILITOT 0.4 04/20/2022   ALKPHOS 82 04/20/2022   AST 25 04/20/2022   ALT 23 04/20/2022   PROT 7.3 04/20/2022   ALBUMIN 3.9 04/20/2022   CALCIUM 9.4 04/20/2022    Speciality Comments: No specialty comments available.  Procedures:  No procedures performed Allergies: Patient has no known allergies.   Assessment / Plan:     Visit Diagnoses: Positive ANA (antinuclear antibody)  DDD (degenerative disc disease), lumbar  Primary osteoarthritis of both hands  Primary osteoarthritis of right hip  Primary osteoarthritis of left knee  Primary osteoarthritis of both feet  Other fatigue  History of osteoporosis  Essential hypertension  Left carotid artery stenosis  History of hyperlipidemia  Language barrier  Orders: No orders of the defined types were placed in this encounter.  No orders of the defined types were placed in this encounter.   Face-to-face time  spent with patient was *** minutes. Greater than 50% of time was spent in counseling and coordination of care.  Follow-Up Instructions: No follow-ups on file.   Gearldine Bienenstock, PA-C  Note - This record has been created using Dragon software.  Chart creation errors have been sought, but may not always  have been located. Such creation errors do not reflect on  the standard of medical care.

## 2023-02-18 ENCOUNTER — Ambulatory Visit: Payer: No Typology Code available for payment source | Admitting: Physician Assistant

## 2023-02-28 ENCOUNTER — Ambulatory Visit: Payer: No Typology Code available for payment source | Admitting: Physician Assistant

## 2023-02-28 DIAGNOSIS — M1611 Unilateral primary osteoarthritis, right hip: Secondary | ICD-10-CM

## 2023-02-28 DIAGNOSIS — Z8739 Personal history of other diseases of the musculoskeletal system and connective tissue: Secondary | ICD-10-CM

## 2023-02-28 DIAGNOSIS — I10 Essential (primary) hypertension: Secondary | ICD-10-CM

## 2023-02-28 DIAGNOSIS — R5383 Other fatigue: Secondary | ICD-10-CM

## 2023-02-28 DIAGNOSIS — Z8639 Personal history of other endocrine, nutritional and metabolic disease: Secondary | ICD-10-CM

## 2023-02-28 DIAGNOSIS — M51369 Other intervertebral disc degeneration, lumbar region without mention of lumbar back pain or lower extremity pain: Secondary | ICD-10-CM

## 2023-02-28 DIAGNOSIS — M1712 Unilateral primary osteoarthritis, left knee: Secondary | ICD-10-CM

## 2023-02-28 DIAGNOSIS — I6522 Occlusion and stenosis of left carotid artery: Secondary | ICD-10-CM

## 2023-02-28 DIAGNOSIS — M19071 Primary osteoarthritis, right ankle and foot: Secondary | ICD-10-CM

## 2023-02-28 DIAGNOSIS — M19041 Primary osteoarthritis, right hand: Secondary | ICD-10-CM

## 2023-02-28 DIAGNOSIS — R768 Other specified abnormal immunological findings in serum: Secondary | ICD-10-CM

## 2023-02-28 DIAGNOSIS — Z603 Acculturation difficulty: Secondary | ICD-10-CM

## 2023-07-18 ENCOUNTER — Other Ambulatory Visit: Payer: Self-pay | Admitting: Vascular Surgery

## 2023-12-04 IMAGING — US US ABDOMEN LIMITED
1 series · 14 of 25 positions shown · non-contrast
Comparison: None.

CLINICAL DATA: Hepatomegaly.

EXAM:
ULTRASOUND ABDOMEN LIMITED RIGHT UPPER QUADRANT

[Series 1: us abdomen limited · 0.15mm/px · 14 of 52 slices shown]
[im 1/52]
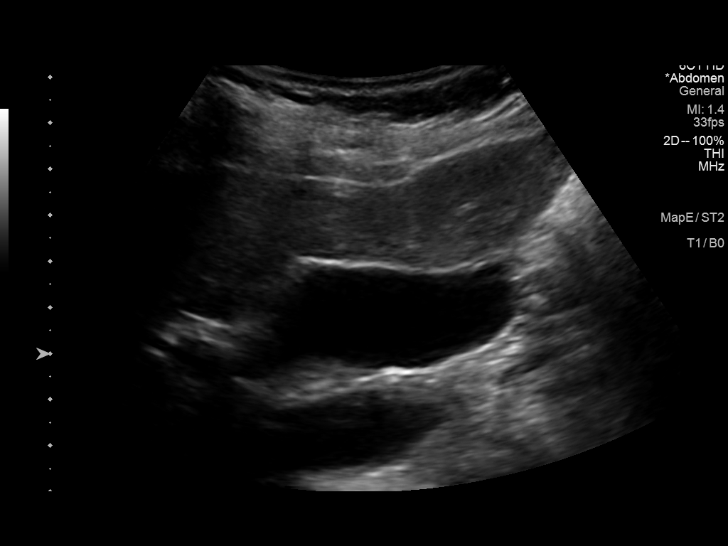
[im 5/52]
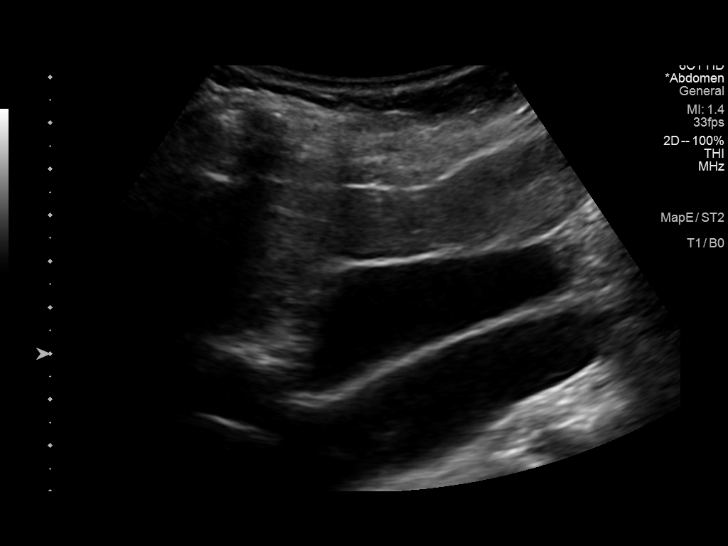
[im 9/52]
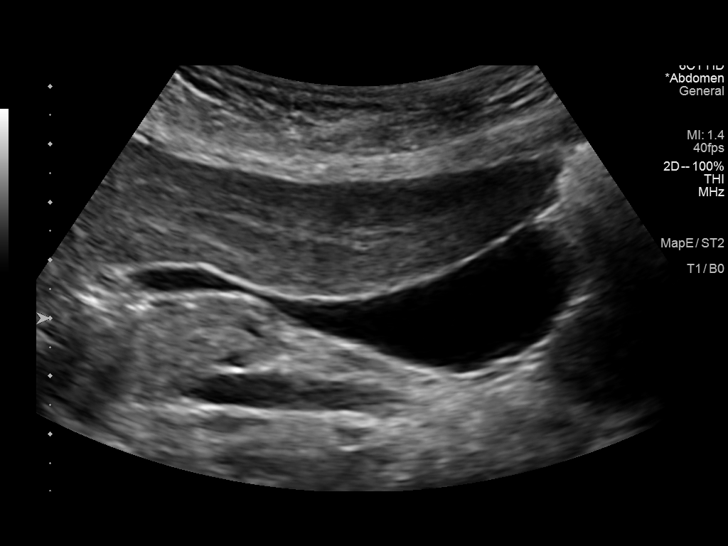
[im 13/52]
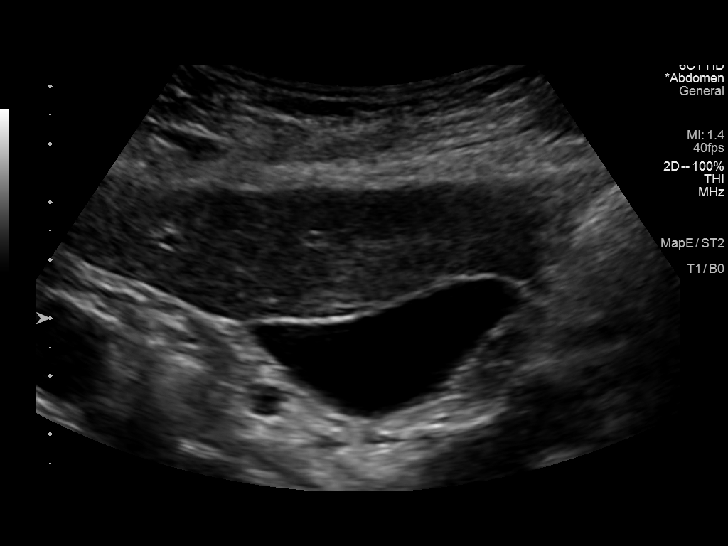
[im 18/52]
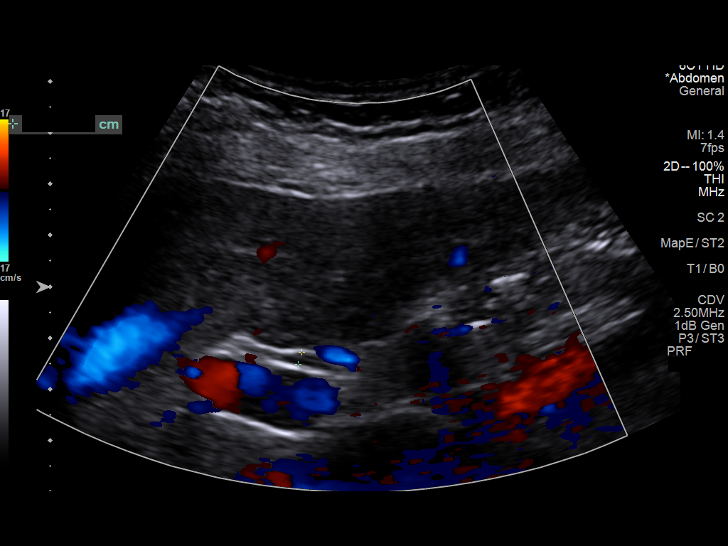
[im 20/52]
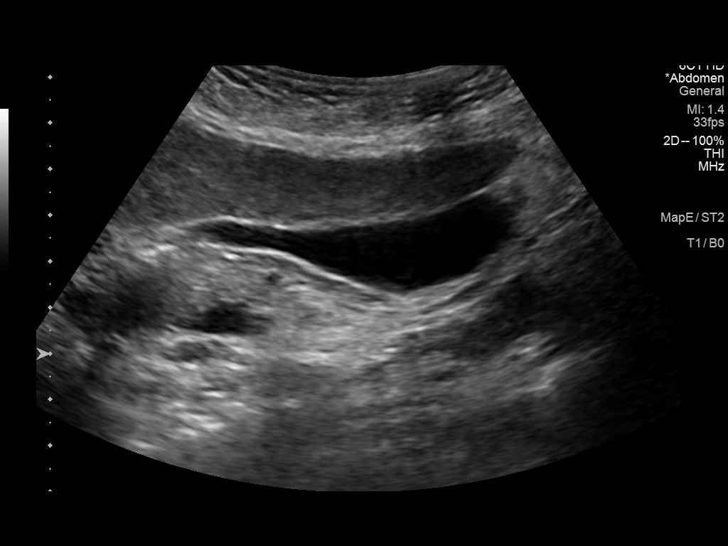
[im 24/52]
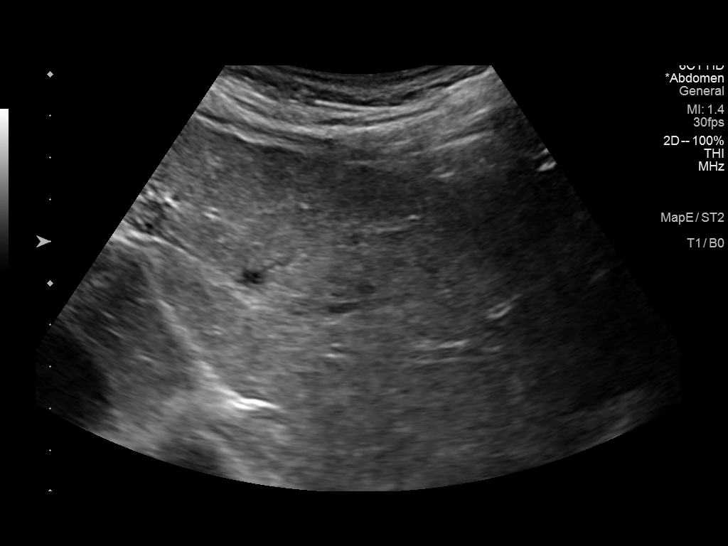
[im 28/52]
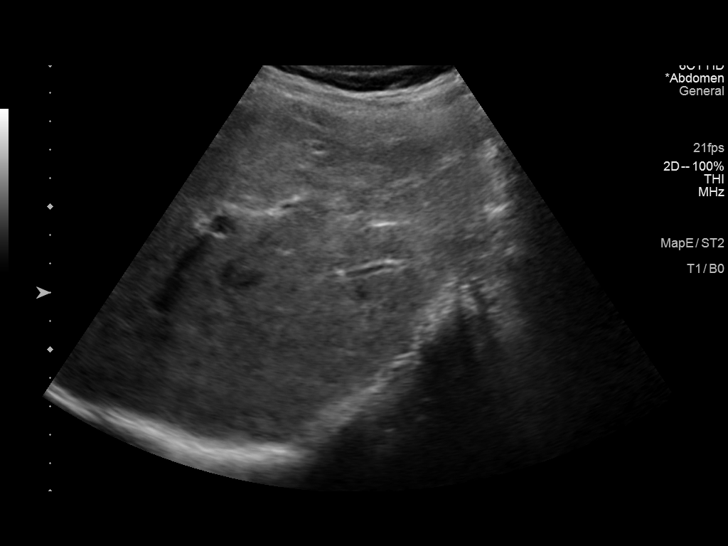
[im 32/52]
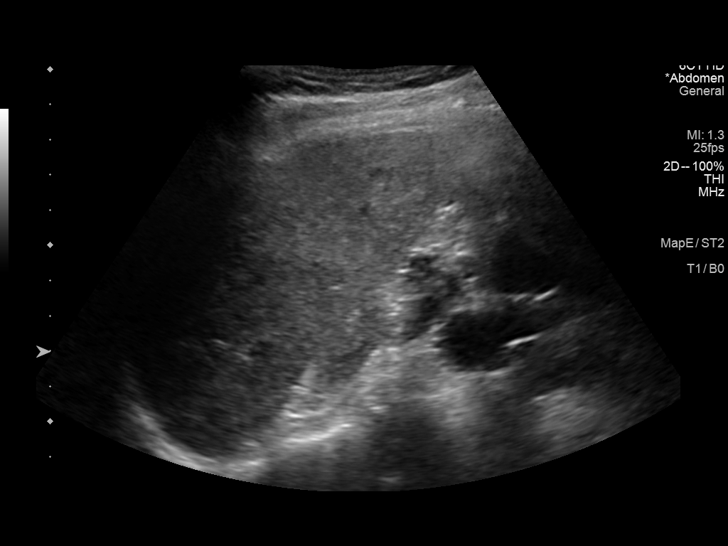
[im 35/52]
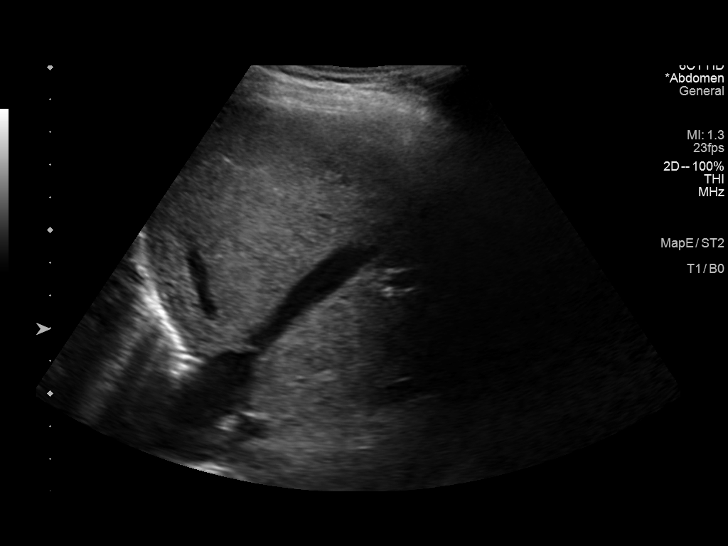
[im 39/52]
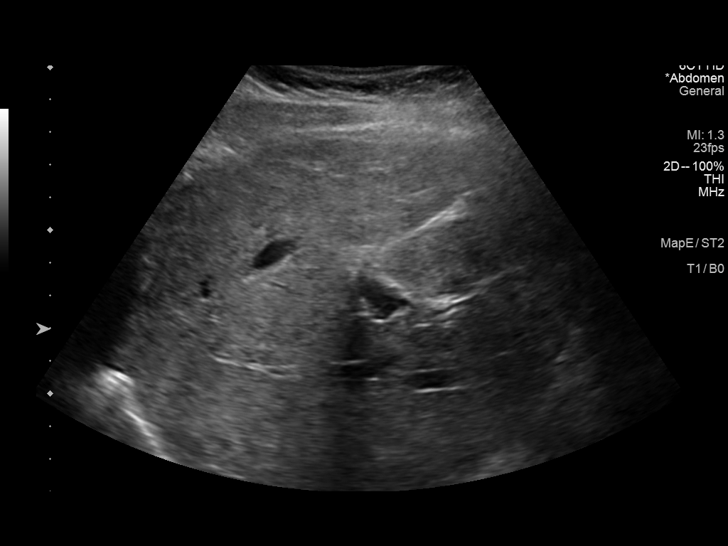
[im 43/52]
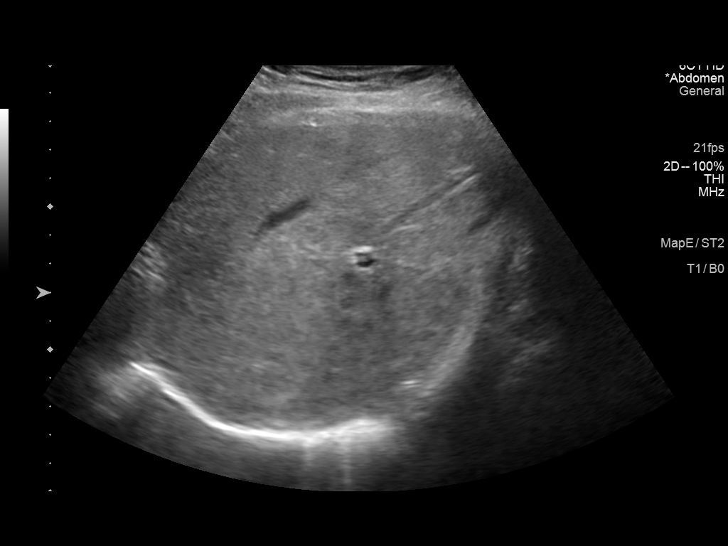
[im 47/52]
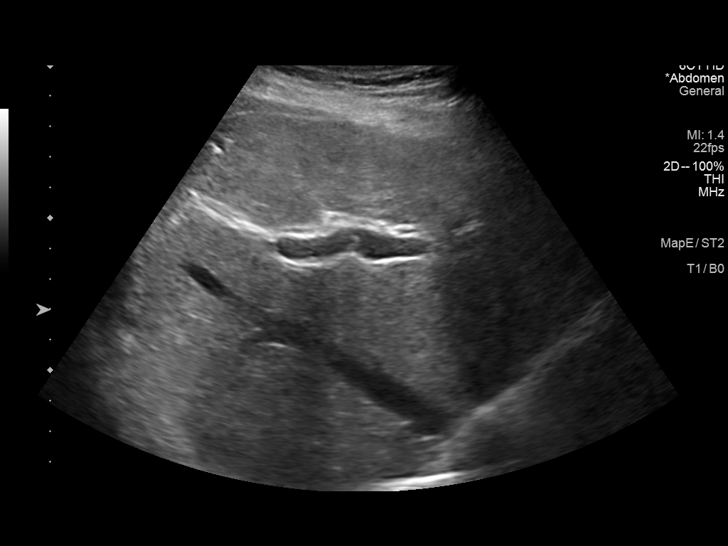
[im 52/52]
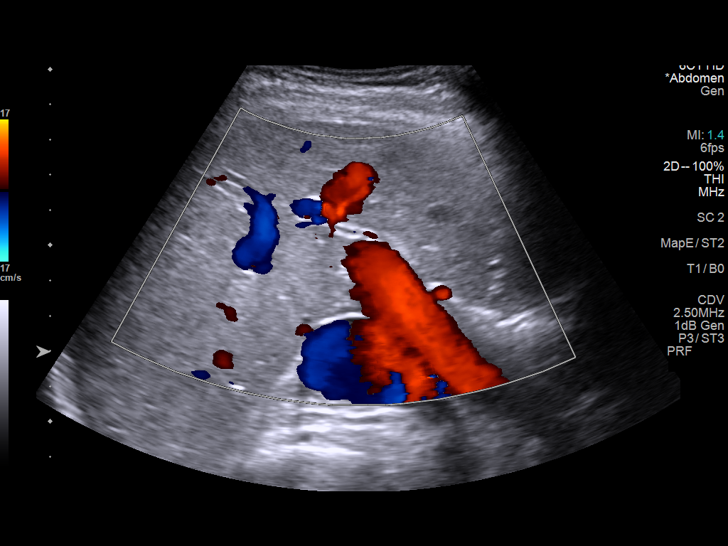

[14 of 25 positions shown; findings below may reference images not displayed]

FINDINGS: Gallbladder:

No gallstones or wall thickening visualized. No sonographic Murphy
sign noted by sonographer.

Common bile duct:

Diameter: 3 mm which is within normal limits.

Liver:

No focal lesion identified. Within normal limits in parenchymal
echogenicity. The liver appears grossly normal in size measuring 14
cm. Portal vein is patent on color Doppler imaging with normal
direction of blood flow towards the liver.

Other: None.
IMPRESSION: No abnormality seen in the right upper quadrant of the abdomen.

## 2024-04-23 ENCOUNTER — Encounter: Payer: Self-pay | Admitting: Vascular Surgery
